# Patient Record
Sex: Female | Born: 1965
Health system: Southern US, Community
[De-identification: ages and names within clinical notes are randomized; demographics above are authoritative.]

## PROBLEM LIST (undated history)

## (undated) DIAGNOSIS — J329 Chronic sinusitis, unspecified: Secondary | ICD-10-CM

## (undated) DIAGNOSIS — R209 Unspecified disturbances of skin sensation: Secondary | ICD-10-CM

## (undated) DIAGNOSIS — J309 Allergic rhinitis, unspecified: Principal | ICD-10-CM

## (undated) DIAGNOSIS — R55 Syncope and collapse: Secondary | ICD-10-CM

## (undated) DIAGNOSIS — M79602 Pain in left arm: Secondary | ICD-10-CM

## (undated) HISTORY — PX: OTHER SURGICAL HISTORY: SHX169

## (undated) HISTORY — DX: Chronic sinusitis, unspecified: J32.9

## (undated) HISTORY — DX: Allergic rhinitis, unspecified: J30.9

## (undated) HISTORY — DX: Unspecified disturbances of skin sensation: R20.9

---

## 1997-11-20 ENCOUNTER — Other Ambulatory Visit: Admission: RE | Admit: 1997-11-20 | Discharge: 1997-11-20 | Payer: Self-pay | Admitting: Gynecology

## 1998-11-27 ENCOUNTER — Other Ambulatory Visit: Admission: RE | Admit: 1998-11-27 | Discharge: 1998-11-27 | Payer: Self-pay | Admitting: Gynecology

## 1999-12-08 ENCOUNTER — Other Ambulatory Visit: Admission: RE | Admit: 1999-12-08 | Discharge: 1999-12-08 | Payer: Self-pay | Admitting: Gynecology

## 2001-02-06 ENCOUNTER — Other Ambulatory Visit: Admission: RE | Admit: 2001-02-06 | Discharge: 2001-02-06 | Payer: Self-pay | Admitting: Gynecology

## 2002-03-27 ENCOUNTER — Other Ambulatory Visit: Admission: RE | Admit: 2002-03-27 | Discharge: 2002-03-27 | Payer: Self-pay | Admitting: Gynecology

## 2002-11-21 ENCOUNTER — Other Ambulatory Visit: Admission: RE | Admit: 2002-11-21 | Discharge: 2002-11-21 | Payer: Self-pay | Admitting: Dermatology

## 2003-05-21 ENCOUNTER — Emergency Department (HOSPITAL_COMMUNITY): Admission: EM | Admit: 2003-05-21 | Discharge: 2003-05-22 | Payer: Self-pay | Admitting: Emergency Medicine

## 2003-05-27 ENCOUNTER — Other Ambulatory Visit: Admission: RE | Admit: 2003-05-27 | Discharge: 2003-05-27 | Payer: Self-pay | Admitting: Gynecology

## 2003-06-04 ENCOUNTER — Encounter: Admission: RE | Admit: 2003-06-04 | Discharge: 2003-06-04 | Payer: Self-pay | Admitting: Gastroenterology

## 2003-06-07 ENCOUNTER — Encounter: Admission: RE | Admit: 2003-06-07 | Discharge: 2003-06-07 | Payer: Self-pay | Admitting: Gastroenterology

## 2003-06-14 ENCOUNTER — Encounter: Admission: RE | Admit: 2003-06-14 | Discharge: 2003-06-14 | Payer: Self-pay | Admitting: Gastroenterology

## 2004-07-21 ENCOUNTER — Other Ambulatory Visit: Admission: RE | Admit: 2004-07-21 | Discharge: 2004-07-21 | Payer: Self-pay | Admitting: Gynecology

## 2004-07-24 ENCOUNTER — Ambulatory Visit: Payer: Self-pay | Admitting: Internal Medicine

## 2005-08-05 ENCOUNTER — Other Ambulatory Visit: Admission: RE | Admit: 2005-08-05 | Discharge: 2005-08-05 | Payer: Self-pay | Admitting: Gynecology

## 2005-08-06 ENCOUNTER — Ambulatory Visit: Payer: Self-pay | Admitting: Family Medicine

## 2005-10-07 ENCOUNTER — Ambulatory Visit: Payer: Self-pay | Admitting: Internal Medicine

## 2005-10-08 ENCOUNTER — Ambulatory Visit: Payer: Self-pay | Admitting: Internal Medicine

## 2006-05-05 ENCOUNTER — Ambulatory Visit: Payer: Self-pay | Admitting: Internal Medicine

## 2006-05-19 ENCOUNTER — Ambulatory Visit: Payer: Self-pay | Admitting: Internal Medicine

## 2006-05-27 ENCOUNTER — Encounter: Admission: RE | Admit: 2006-05-27 | Discharge: 2006-05-27 | Payer: Self-pay | Admitting: Obstetrics and Gynecology

## 2007-03-03 ENCOUNTER — Ambulatory Visit: Payer: Self-pay | Admitting: Internal Medicine

## 2007-03-03 DIAGNOSIS — R51 Headache: Secondary | ICD-10-CM | POA: Insufficient documentation

## 2007-03-03 DIAGNOSIS — R209 Unspecified disturbances of skin sensation: Secondary | ICD-10-CM | POA: Insufficient documentation

## 2007-03-03 DIAGNOSIS — R519 Headache, unspecified: Secondary | ICD-10-CM | POA: Insufficient documentation

## 2007-03-03 HISTORY — DX: Unspecified disturbances of skin sensation: R20.9

## 2007-04-26 ENCOUNTER — Ambulatory Visit: Payer: Self-pay | Admitting: Internal Medicine

## 2007-04-26 LAB — CONVERTED CEMR LAB
ALT: 12 units/L (ref 0–35)
AST: 14 units/L (ref 0–37)
Albumin: 3.6 g/dL (ref 3.5–5.2)
Alkaline Phosphatase: 49 units/L (ref 39–117)
BUN: 9 mg/dL (ref 6–23)
Basophils Absolute: 0 10*3/uL (ref 0.0–0.1)
Basophils Relative: 0.6 % (ref 0.0–1.0)
Bilirubin Urine: NEGATIVE
Bilirubin, Direct: 0.2 mg/dL (ref 0.0–0.3)
CO2: 29 meq/L (ref 19–32)
Calcium: 9.2 mg/dL (ref 8.4–10.5)
Chloride: 105 meq/L (ref 96–112)
Cholesterol: 206 mg/dL (ref 0–200)
Creatinine, Ser: 0.8 mg/dL (ref 0.4–1.2)
Direct LDL: 126 mg/dL
Eosinophils Absolute: 0.5 10*3/uL (ref 0.0–0.6)
Eosinophils Relative: 6.2 % — ABNORMAL HIGH (ref 0.0–5.0)
GFR calc Af Amer: 103 mL/min
GFR calc non Af Amer: 85 mL/min
Glucose, Bld: 86 mg/dL (ref 70–99)
Glucose, Urine, Semiquant: NEGATIVE
HCT: 38.1 % (ref 36.0–46.0)
HDL: 50.6 mg/dL (ref >39.0)
Hemoglobin: 13.5 g/dL (ref 12.0–15.0)
Ketones, urine, test strip: NEGATIVE
Lymphocytes Relative: 27 % (ref 12.0–46.0)
MCHC: 35.5 g/dL (ref 30.0–36.0)
MCV: 91.2 fL (ref 78.0–100.0)
Monocytes Absolute: 0.5 10*3/uL (ref 0.2–0.7)
Monocytes Relative: 6.6 % (ref 3.0–11.0)
Neutro Abs: 4.9 10*3/uL (ref 1.4–7.7)
Neutrophils Relative %: 59.6 % (ref 43.0–77.0)
Nitrite: NEGATIVE
Platelets: 281 10*3/uL (ref 150–400)
Potassium: 4.3 meq/L (ref 3.5–5.1)
Protein, U semiquant: NEGATIVE
RBC: 4.17 M/uL (ref 3.87–5.11)
RDW: 11.2 % — ABNORMAL LOW (ref 11.5–14.6)
Sodium: 140 meq/L (ref 135–145)
Specific Gravity, Urine: 1.025
TSH: 1.95 microintl units/mL (ref 0.35–5.50)
Total Bilirubin: 0.7 mg/dL (ref 0.3–1.2)
Total CHOL/HDL Ratio: 4.1
Total Protein: 6.6 g/dL (ref 6.0–8.3)
Triglycerides: 128 mg/dL (ref 0–149)
Urobilinogen, UA: 0.2
VLDL: 26 mg/dL (ref 0–40)
WBC: 8.1 10*3/uL (ref 4.5–10.5)
pH: 5.5

## 2007-05-18 ENCOUNTER — Ambulatory Visit: Payer: Self-pay | Admitting: Internal Medicine

## 2007-05-18 DIAGNOSIS — J309 Allergic rhinitis, unspecified: Secondary | ICD-10-CM

## 2007-05-18 HISTORY — DX: Allergic rhinitis, unspecified: J30.9

## 2007-07-18 ENCOUNTER — Encounter: Admission: RE | Admit: 2007-07-18 | Discharge: 2007-07-18 | Payer: Self-pay | Admitting: Gynecology

## 2007-10-12 ENCOUNTER — Ambulatory Visit: Payer: Self-pay | Admitting: Internal Medicine

## 2007-10-12 DIAGNOSIS — J019 Acute sinusitis, unspecified: Secondary | ICD-10-CM | POA: Insufficient documentation

## 2008-01-30 ENCOUNTER — Telehealth: Payer: Self-pay | Admitting: Internal Medicine

## 2009-03-19 ENCOUNTER — Encounter: Admission: RE | Admit: 2009-03-19 | Discharge: 2009-03-19 | Payer: Self-pay | Admitting: Gynecology

## 2009-03-24 ENCOUNTER — Telehealth: Payer: Self-pay | Admitting: Internal Medicine

## 2010-04-09 ENCOUNTER — Encounter
Admission: RE | Admit: 2010-04-09 | Discharge: 2010-04-09 | Payer: Self-pay | Source: Home / Self Care | Attending: Gynecology | Admitting: Gynecology

## 2010-05-26 NOTE — Assessment & Plan Note (Signed)
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  The headaches are described as mild and paresthesias also mild and fleeting  Current Allergies: No known allergies       Physical Exam  General:     Well-developed,well-nourished,in no acute distress; alert,appropriate and cooperative throughout examination Head:     Normocephalic and atraumatic without obvious abnormalities. No apparent alopecia or balding. Eyes:     No corneal or conjunctival inflammation noted. EOMI. Perrla. Funduscopic exam benign, without hemorrhages, exudates or papilledema. Vision grossly normal. Ears:     External ear exam shows no significant lesions or deformities.  Otoscopic examination reveals clear canals, tympanic membranes are intact bilaterally without bulging, retraction, inflammation or discharge. Hearing is grossly normal bilaterally. Mouth:     Oral mucosa and oropharynx without lesions or exudates.  Teeth in good repair. Neck:     No deformities, masses, or tenderness noted. Lungs:     Normal respiratory effort, chest expands symmetrically. Lungs are clear to auscultation, no crackles or wheezes. Heart:     Normal rate and regular rhythm. S1 and S2 normal without gallop, murmur, click, rub or other extra sounds. Neurologic:     alert & oriented X3, cranial nerves II-XII intact, strength normal in all extremities, sensation intact to light touch, gait normal, finger-to-nose normal, toes down bilaterally on Babinski,  and Romberg negative.      Impression & Recommendations:  Problem # 1:  PARESTHESIA (ICD-782.0)  Problem # 2:  HEADACHE (ICD-784.0)   Patient Instructions: 1)  schedule a  complete physical with Dr. Lovell Sheehan at your convenience.  Call if not improved    ]

## 2010-05-26 NOTE — Progress Notes (Signed)
Summary: sinus  Phone Note Call from Patient   Caller: Patient Call For: Dr. Lovell Sheehan Summary of Call: Pt would like sinus Rx called to [facility]).   She has post nasal drainage with headache.  No fever.  Bloody nasal mucus. Nausea x 3 days. [phone] Initial call taken by: Lynann Beaver CMA,  January 30, 2008 9:53 AM  Follow-up for Phone Call        may have z pack and mucinex otc per dr Lovell Sheehan Follow-up by: Willy Eddy, LPN,  January 30, 2008 11:06 AM  Additional Follow-up for Phone Call Additional follow up Details #1::        Patient aware. Additional Follow-up by: Rudy Jew, RN,  January 30, 2008 11:23 AM    New/Updated Medications: ZITHROMAX Z-PAK 250 MG TABS (AZITHROMYCIN) as directed   Prescriptions: ZITHROMAX Z-PAK 250 MG TABS (AZITHROMYCIN) as directed  #1 x 0   Entered by:   Rudy Jew, RN   Authorized by:   Stacie Glaze MD   Signed by:   Rudy Jew, RN on 01/30/2008   Method used:   Telephoned to ...         RxID:   [id number]

## 2010-05-26 NOTE — Assessment & Plan Note (Signed)
Summary: physical/mae   Vital Signs:  Patient Profile:   45 Years Old Female Weight:      129 pounds Temp:     98.5 degrees F rectal Pulse rate:   69 / minute Resp:     14 per minute BP sitting:   110 / 70  (left arm)  Vitals Entered By: Willy Eddy, LPN (May 18, 2007 2:06 PM)                 Chief Complaint:  CPX/DR BOWIE DOES YEARLY PAP AND BREAST EXAM/DT IN 2004/.  History of Present Illness: CPX Current Problems:  PHYSICAL EXAMINATION (ICD-V70.0) PARESTHESIA (ICD-782.0) HEADACHE (ICD-784.0)    Current Allergies (reviewed today): No known allergies  Updated/Current Medications (including changes made in today's visit):  TRIVORA (28)   TABS (LEVONORG-ETH ESTRAD TRIPHASIC) AS DIRECTED   Past Medical History:    Reviewed history and no changes required:       Allergic rhinitis  Past Surgical History:    Reviewed history and no changes required:       Denies surgical history   Family History:    Reviewed history and no changes required:       mother         Family History High cholesterol       Family History Hypertension       father  Social History:    Reviewed history and no changes required:       Married       Never Smoked       Alcohol use-no       Drug use-no       Regular exercise-yes   Risk Factors:  Tobacco use:  never Passive smoke exposure:  no Drug use:  no HIV high-risk behavior:  no Alcohol use:  no Exercise:  yes  Family History Risk Factors:    Family History of MI in females < 39 years old:  no    Family History of MI in males < 80 years old:  no   Review of Systems  The patient denies anorexia, fever, weight loss, vision loss, decreased hearing, hoarseness, chest pain, syncope, dyspnea on exhertion, peripheral edema, prolonged cough, hemoptysis, abdominal pain, melena, hematochezia, severe indigestion/heartburn, hematuria, incontinence, genital sores, muscle weakness, suspicious skin lesions, transient  blindness, difficulty walking, depression, unusual weight change, abnormal bleeding, enlarged lymph nodes, angioedema, and breast masses.     Physical Exam  General:     Well-developed,well-nourished,in no acute distress; alert,appropriate and cooperative throughout examination Head:     Normocephalic and atraumatic without obvious abnormalities. No apparent alopecia or balding. Eyes:     No corneal or conjunctival inflammation noted. EOMI. Perrla. Funduscopic exam benign, without hemorrhages, exudates or papilledema. Vision grossly normal. Ears:     External ear exam shows no significant lesions or deformities.  Otoscopic examination reveals clear canals, tympanic membranes are intact bilaterally without bulging, retraction, inflammation or discharge. Hearing is grossly normal bilaterally. Nose:     External nasal examination shows no deformity or inflammation. Nasal mucosa are pink and moist without lesions or exudates. Mouth:     Oral mucosa and oropharynx without lesions or exudates.  Teeth in good repair. Neck:     No deformities, masses, or tenderness noted. Chest Wall:     No deformities, masses, or tenderness noted. Lungs:     Normal respiratory effort, chest expands symmetrically. Lungs are clear to auscultation, no crackles or wheezes. Heart:  Normal rate and regular rhythm. S1 and S2 normal without gallop, murmur, click, rub or other extra sounds. Abdomen:     Bowel sounds positive,abdomen soft and non-tender without masses, organomegaly or hernias noted. Msk:     No deformity or scoliosis noted of thoracic or lumbar spine.   Pulses:     R and L carotid,radial,femoral,dorsalis pedis and posterior tibial pulses are full and equal bilaterally Extremities:     No clubbing, cyanosis, edema, or deformity noted with normal full range of motion of all joints.   Neurologic:     No cranial nerve deficits noted. Station and gait are normal. Plantar reflexes are down-going  bilaterally. DTRs are symmetrical throughout. Sensory, motor and coordinative functions appear intact.    Complete Medication List: 1)  Trivora (28) Tabs (Levonorg-eth estrad triphasic) .... As directed   Patient Instructions: 1)  Please schedule a follow-up appointment in 1 year. 2)  take fish oil 1000mg   two capsules daily    ]

## 2010-05-26 NOTE — Assessment & Plan Note (Signed)
Summary: sinus problems/njr   Vital Signs:  Patient Profile:   45 Years Old Female Temp:     98.7 degrees F oral Pulse rate:   76 / minute Resp:     14 per minute BP sitting:   110 / 70  (left arm)  Vitals Entered By: Willy Eddy, LPN (October 12, 2007 11:44 AM)                 Chief Complaint:  c/o sinusitis like sx with  cough and URI symptoms.  History of Present Illness: Current Problems:  ALLERGIC RHINITIS (ICD-477.9) PHYSICAL EXAMINATION (ICD-V70.0) PARESTHESIA (ICD-782.0) HEADACHE (ICD-784.0)    URI Symptoms      This is a 45 year old woman who presents with URI symptoms.  The patient reports nasal congestion, sore throat, and dry cough.  Associated symptoms include low-grade fever (<100.5 degrees).  The patient denies fever, fever of 100.5-103 degrees, fever of 103.1-104 degrees, fever to >104 degrees, stiff neck, dyspnea, wheezing, rash, vomiting, diarrhea, use of an antipyretic, and response to antipyretic.  The patient also reports itchy throat, headache, muscle aches, and severe fatigue.  The patient denies the following risk factors for Strep sinusitis: unilateral facial pain, unilateral nasal discharge, poor response to decongestant, double sickening, tooth pain, Strep exposure, tender adenopathy, and absence of cough.      Current Allergies: No known allergies   Past Medical History:    Reviewed history from 05/18/2007 and no changes required:       Allergic rhinitis   Family History:    Reviewed history from 05/18/2007 and no changes required:       mother         Family History High cholesterol       Family History Hypertension       father  Social History:    Reviewed history from 05/18/2007 and no changes required:       Married       Never Smoked       Alcohol use-no       Drug use-no       Regular exercise-yes    Review of Systems       The patient complains of fever, hoarseness, chest pain, and prolonged cough.  The patient denies  anorexia, weight loss, weight gain, vision loss, decreased hearing, syncope, dyspnea on exertion, peripheral edema, headaches, hemoptysis, abdominal pain, melena, hematochezia, severe indigestion/heartburn, hematuria, incontinence, genital sores, muscle weakness, suspicious skin lesions, transient blindness, difficulty walking, depression, unusual weight change, abnormal bleeding, enlarged lymph nodes, angioedema, and breast masses.     Physical Exam  General:     Well-developed,well-nourished,in no acute distress; alert,appropriate and cooperative throughout examination Eyes:     No corneal or conjunctival inflammation noted. EOMI. Perrla. Funduscopic exam benign, without hemorrhages, exudates or papilledema. Vision grossly normal. Ears:     External ear exam shows no significant lesions or deformities.  Otoscopic examination reveals clear canals, tympanic membranes are intact bilaterally without bulging, retraction, inflammation or discharge. Hearing is grossly normal bilaterally. Nose:     mucosal erythema and mucosal edema.   Neck:     supple and cervical lymphadenopathy.   Lungs:     Normal respiratory effort, chest expands symmetrically. Lungs are clear to auscultation, no crackles or wheezes. Heart:     Normal rate and regular rhythm. S1 and S2 normal without gallop, murmur, click, rub or other extra sounds. Abdomen:     Bowel sounds positive,abdomen soft and  non-tender without masses, organomegaly or hernias noted.    Impression & Recommendations:  Problem # 1:  ALLERGIC RHINITIS (ICD-477.9) precipatant  Problem # 2:  ACUTE SINUSITIS, UNSPECIFIED (ICD-461.9)  Her updated medication list for this problem includes:    Avelox 400 Mg Tabs (Moxifloxacin hcl) ..... One by mouth daily    Maxiphen Dm 10-20-400 Mg Tabs (Phenylephrine-dm-gg) ..... One by mouth q 6 hr Instructed on treatment. Call if symptoms persist or worsen.   Problem # 3:  HEADACHE (ICD-784.0) Headache diary  reviewed.   Complete Medication List: 1)  Trivora (43) Tabs (Levonorg-eth estrad triphasic) .... As directed 2)  Avelox 400 Mg Tabs (Moxifloxacin hcl) .... One by mouth daily 3)  Maxiphen Dm 10-20-400 Mg Tabs (Phenylephrine-dm-gg) .... One by mouth q 6 hr   Patient Instructions: 1)  Take your antibiotic as prescribed until ALL of it is gone, but stop if you develop a rash or swelling and contact our office as soon as possible.   ]

## 2010-05-26 NOTE — Progress Notes (Signed)
Summary: WANTS REILEF  Phone Note Call from Patient Call back at [phone]   Caller: Patient-LIVE Call Complaint: Cough/Sore throat Summary of Call: HAS SINUS CONGESTION, NON PRODUCTIVE COUGH, RUNNY NOSE, NO FEVER. PT WOULD LIKE ABX AND COUGH SYRUP CALLED TO [facility]. Initial call taken by: Warnell Forester,  March 24, 2009 4:00 PM  Follow-up for Phone Call        zpack and atuss called in per dr Esperanza Heir and pt notified Follow-up by: Willy Eddy, LPN,  March 24, 2009 5:23 PM    New/Updated Medications: ATUSS DS 30-4-30 MG/5ML SUSP (PSEUDOEPHED HCL-CPM-DM HBR TAN) 2 tsp every 12 hours Prescriptions: ATUSS DS 30-4-30 MG/5ML SUSP (PSEUDOEPHED HCL-CPM-DM HBR TAN) 2 tsp every 12 hours  #6oz x 1   Entered by:   Willy Eddy, LPN   Authorized by:   Stacie Glaze MD   Signed by:   Willy Eddy, LPN on 84/69/6295   Method used:   Electronically to        [facility] (retail)       [address]       [place], [place]  [address]       Ph: [phone]       Fax: [phone]   RxID:   [id number] ZITHROMAX Z-PAK 250 MG TABS (AZITHROMYCIN) as directed  #1 x 0   Entered by:   Willy Eddy, LPN   Authorized by:   Stacie Glaze MD   Signed by:   Willy Eddy, LPN on 18/84/1660   Method used:   Electronically to        [facility] (retail)       [address]       [place], [place]  [address]       Ph: [phone]       Fax: [phone]   RxID:   [id number]

## 2010-09-24 ENCOUNTER — Encounter: Payer: Self-pay | Admitting: Internal Medicine

## 2010-09-24 ENCOUNTER — Ambulatory Visit (INDEPENDENT_AMBULATORY_CARE_PROVIDER_SITE_OTHER): Payer: BC Managed Care – PPO | Admitting: Internal Medicine

## 2010-09-24 VITALS — BP 108/70 | Temp 98.7°F | Ht 64.0 in | Wt 128.0 lb

## 2010-09-24 DIAGNOSIS — J329 Chronic sinusitis, unspecified: Secondary | ICD-10-CM

## 2010-09-24 HISTORY — DX: Chronic sinusitis, unspecified: J32.9

## 2010-09-24 MED ORDER — AMOXICILLIN-POT CLAVULANATE 875-125 MG PO TABS: 1.0000 | ORAL_TABLET | Freq: Two times a day (BID) | ORAL | Status: AC

## 2010-09-24 NOTE — Assessment & Plan Note (Signed)
Begin Augmetin x 7 days. Change AH to benadryl at night. Followup if no improvement or worsening.

## 2010-09-24 NOTE — Progress Notes (Signed)
  Subjective:    Patient ID: Robyn Medina, female    DOB: May 16, 1965, 45 y.o.   MRN: [id number]  HPI Pt presents to clinic for evaluation of sinus congestion. Notes 8+day h/o bilateral maxillary and frontal sinus pressure and teeth pain. Has associated nasal congestion and drainage worse at night. No significant cough at this point. Denies f/c. Taking otc anti-histamine, decongestant and mucinex without significant improvement. No alleviating or exacerbating factors. No other complaints.  Reviewed pmh, medications and allergies.    Review of Systems see hpi     Objective:   Physical Exam  Nursing note and vitals reviewed. Constitutional: She appears well-developed and well-nourished. No distress.  HENT:  Head: Normocephalic and atraumatic.  Right Ear: Tympanic membrane, external ear and ear canal normal.  Left Ear: Tympanic membrane, external ear and ear canal normal.  Mouth/Throat: Uvula is midline and mucous membranes are normal. Posterior oropharyngeal erythema present. No oropharyngeal exudate or tonsillar abscesses.  Eyes: Conjunctivae are normal. Right eye exhibits no discharge. Left eye exhibits no discharge. No scleral icterus.  Neck: Neck supple.  Lymphadenopathy:    She has no cervical adenopathy.  Neurological: She is alert.  Skin: Skin is warm and dry. She is not diaphoretic.  Psychiatric: She has a normal mood and affect.          Assessment & Plan:

## 2011-01-26 ENCOUNTER — Ambulatory Visit (INDEPENDENT_AMBULATORY_CARE_PROVIDER_SITE_OTHER): Payer: BC Managed Care – PPO | Admitting: Internal Medicine

## 2011-01-26 ENCOUNTER — Encounter: Payer: Self-pay | Admitting: Internal Medicine

## 2011-01-26 DIAGNOSIS — J309 Allergic rhinitis, unspecified: Secondary | ICD-10-CM

## 2011-01-26 DIAGNOSIS — J069 Acute upper respiratory infection, unspecified: Secondary | ICD-10-CM

## 2011-01-26 MED ORDER — HYDROCODONE-HOMATROPINE 5-1.5 MG/5ML PO SYRP: 5.0000 mL | ORAL_SOLUTION | Freq: Four times a day (QID) | ORAL | Status: AC | PRN

## 2011-01-26 NOTE — Progress Notes (Signed)
  Subjective:    Patient ID: Robyn Medina, female    DOB: [birth year], 45 y.o.   MRN: [id number]  HPI  45 year old patient who presents with a seven-day history of head and chest congestion. Her most significant symptom is refractory in incessant cough. Cough is nonproductive there has been no fever or purulent sputum production denies any wheezing chest pain high fever or chills she has been using over-the-counter medications without much benefit. Her husband has a similar illness    Review of Systems  Constitutional: Negative.   HENT: Positive for congestion and rhinorrhea. Negative for hearing loss, sore throat, dental problem, sinus pressure and tinnitus.   Eyes: Negative for pain, discharge and visual disturbance.  Respiratory: Positive for cough. Negative for shortness of breath.   Cardiovascular: Negative for chest pain, palpitations and leg swelling.  Gastrointestinal: Negative for nausea, vomiting, abdominal pain, diarrhea, constipation, blood in stool and abdominal distention.  Genitourinary: Negative for dysuria, urgency, frequency, hematuria, flank pain, vaginal bleeding, vaginal discharge, difficulty urinating, vaginal pain and pelvic pain.  Musculoskeletal: Negative for joint swelling, arthralgias and gait problem.  Skin: Negative for rash.  Neurological: Negative for dizziness, syncope, speech difficulty, weakness, numbness and headaches.  Hematological: Negative for adenopathy.  Psychiatric/Behavioral: Negative for behavioral problems, dysphoric mood and agitation. The patient is not nervous/anxious.        Objective:   Physical Exam  Constitutional: She is oriented to person, place, and time. She appears well-developed and well-nourished.  HENT:  Head: Normocephalic.  Right Ear: External ear normal.  Left Ear: External ear normal.  Mouth/Throat: Oropharynx is clear and moist.  Eyes: Conjunctivae and EOM are normal. Pupils are equal, round, and reactive to light.  Neck:  Normal range of motion. Neck supple. No thyromegaly present.  Cardiovascular: Normal rate, regular rhythm, normal heart sounds and intact distal pulses.   Pulmonary/Chest: Effort normal and breath sounds normal.  Abdominal: Soft. Bowel sounds are normal. She exhibits no mass. There is no tenderness.  Musculoskeletal: Normal range of motion.  Lymphadenopathy:    She has no cervical adenopathy.  Neurological: She is alert and oriented to person, place, and time.  Skin: Skin is warm and dry. No rash noted.  Psychiatric: She has a normal mood and affect. Her behavior is normal.          Assessment & Plan:    Viral URI. Will treat symptomatically with Hydromet. Will also give a nasal steroid  (Nasonex)

## 2011-01-26 NOTE — Patient Instructions (Signed)
Get plenty of rest, Drink lots of  clear liquids, and use Tylenol or ibuprofen for fever and discomfort.    Call or return to clinic prn if these symptoms worsen or fail to improve as anticipated.  Nasonex  Use once daily

## 2011-04-26 ENCOUNTER — Telehealth: Payer: Self-pay | Admitting: *Deleted

## 2011-04-26 MED ORDER — CITALOPRAM HYDROBROMIDE 20 MG PO TABS: 20.0000 mg | ORAL_TABLET | Freq: Every day | ORAL | Status: DC

## 2011-04-26 MED ORDER — ALPRAZOLAM 0.25 MG PO TABS: 0.2500 mg | ORAL_TABLET | Freq: Every evening | ORAL | Status: AC | PRN

## 2011-04-26 NOTE — Telephone Encounter (Signed)
Pt has been crying non stop.  No energy, doesn't want to go anywhere or do anything.  Denies suicidal thoughts.  Per Dr Lovell Sheehan ok to call in xanax .25mg  qhs for anxiety and sleep and celexa 20 mg once daily #30 with 1 refill. [facility] [place].  Dr Lovell Sheehan would like to see in her 1 week. rx's called in

## 2011-04-28 NOTE — Telephone Encounter (Signed)
Left message on machine For pt to return call and tell her of ov on 1-7 ay 8:15

## 2011-04-28 NOTE — Telephone Encounter (Signed)
Left message on machine For appointment on 1-7 at 8:15

## 2011-05-03 ENCOUNTER — Ambulatory Visit (INDEPENDENT_AMBULATORY_CARE_PROVIDER_SITE_OTHER): Payer: BC Managed Care – PPO | Admitting: Internal Medicine

## 2011-05-03 ENCOUNTER — Encounter: Payer: Self-pay | Admitting: Internal Medicine

## 2011-05-03 VITALS — BP 120/70 | HR 72 | Temp 98.3°F | Resp 14 | Ht 64.0 in | Wt 122.0 lb

## 2011-05-03 DIAGNOSIS — F430 Acute stress reaction: Secondary | ICD-10-CM

## 2011-05-03 DIAGNOSIS — M62838 Other muscle spasm: Secondary | ICD-10-CM

## 2011-05-03 MED ORDER — CHLORZOXAZONE 250 MG PO TABS: 250.0000 mg | ORAL_TABLET | Freq: Four times a day (QID) | ORAL | Status: AC | PRN

## 2011-05-03 NOTE — Progress Notes (Signed)
Subjective:    Patient ID: Robyn Medina, female    DOB: 1965/07/17, 46 y.o.   MRN: [id number]  HPI Patient is a 46 year old white female who decompensated from a chemotherapy to stress was tearful crying unable to work we initiated therapy with Xanax and Celexa and she has had an interval change she is able to cope she states that her coworkers have noticed that she is responding better and not as tearful as she was before and she feels herself that she is doing better.  She admits that recently there've been increased stressors in her life and she has stopped exercising which was a stress reliever.  We discussed referral to psychiatry for counseling and she has accepted a referral to [facility] on.  She has history of allergic rhinitis headaches and she has increased neck tension pain today  She states that she has occipital pain radiating down the posterior neck into the trapezius bilaterally increased with motion of the head   Review of Systems  Constitutional: Negative for activity change, appetite change and fatigue.  HENT: Negative for ear pain, congestion, neck pain, postnasal drip and sinus pressure.   Eyes: Negative for redness and visual disturbance.  Respiratory: Negative for cough, shortness of breath and wheezing.   Gastrointestinal: Negative for abdominal pain and abdominal distention.  Genitourinary: Negative for dysuria, frequency and menstrual problem.  Musculoskeletal: Negative for myalgias, joint swelling and arthralgias.  Skin: Negative for rash and wound.  Neurological: Negative for dizziness, weakness and headaches.  Hematological: Negative for adenopathy. Does not bruise/bleed easily.  Psychiatric/Behavioral: Negative for sleep disturbance and decreased concentration.   Past Medical History  Diagnosis Date  . ALLERGIC RHINITIS 05/18/2007  . Headache 03/03/2007  . PARESTHESIA 03/03/2007  . Sinusitis 09/24/2010    History   Social History  . Marital Status: Married     Spouse Name: N/A    Number of Children: N/A  . Years of Education: N/A   Occupational History  . Not on file.   Social History Main Topics  . Smoking status: Never Smoker   . Smokeless tobacco: Never Used  . Alcohol Use: Yes  . Drug Use: No  . Sexually Active: Not on file   Other Topics Concern  . Not on file   Social History Narrative  . No narrative on file    No past surgical history on file.  Family History  Problem Relation Age of Onset  . Hyperlipidemia Neg Hx     family hx  . Hypertension Neg Hx     family hx    No Known Allergies  Current Outpatient Prescriptions on File Prior to Visit  Medication Sig Dispense Refill  . ALPRAZolam (XANAX) 0.25 MG tablet Take 1 tablet (0.25 mg total) by mouth at bedtime as needed for sleep or anxiety.  20 tablet  0  . citalopram (CELEXA) 20 MG tablet Take 1 tablet (20 mg total) by mouth daily.  30 tablet  1  . loratadine-pseudoephedrine (CLARITIN-D 24-HOUR) 10-240 MG per 24 hr tablet Take 1 tablet by mouth daily.          BP 120/70  Pulse 72  Temp 98.3 F (36.8 C)  Resp 14  Ht 5\' 4"  (1.626 m)  Wt 122 lb (55.339 kg)  BMI 20.94 kg/m2       Objective:   Physical Exam  Nursing note and vitals reviewed. Constitutional: She is oriented to person, place, and time. She appears well-developed and well-nourished. No distress.  HENT:  Head: Normocephalic and atraumatic.  Right Ear: External ear normal.  Left Ear: External ear normal.  Nose: Nose normal.  Mouth/Throat: Oropharynx is clear and moist.  Eyes: Conjunctivae and EOM are normal. Pupils are equal, round, and reactive to light.  Neck: Normal range of motion. Neck supple. No JVD present. No tracheal deviation present. No thyromegaly present.  Cardiovascular: Normal rate, regular rhythm, normal heart sounds and intact distal pulses.   No murmur heard. Pulmonary/Chest: Effort normal and breath sounds normal. She has no wheezes. She exhibits no tenderness.    Abdominal: Soft. Bowel sounds are normal.  Musculoskeletal: Normal range of motion. She exhibits no edema and no tenderness.  Lymphadenopathy:    She has no cervical adenopathy.  Neurological: She is alert and oriented to person, place, and time. She has normal reflexes. No cranial nerve deficit.  Skin: Skin is warm and dry. She is not diaphoretic.  Psychiatric: She has a normal mood and affect. Her behavior is normal.          Assessment & Plan:  For the next does him we have prescribed Parafon Forte 250 mg by mouth 4 times a day as needed we recommended she take 200 mg of Advil along with Parafon Forte as required and begin neck stretching and ergonomic review of workplace stress.  For her depression we continue the Celexa and Xanax with a referral for counseling.  We discussed this 30 minutes face-to-face

## 2011-05-03 NOTE — Patient Instructions (Addendum)
The patient is instructed to continue all medications as prescribed. Schedule followup with check out clerk upon leaving the clinic Take advil 200 mg with the parafon forte

## 2011-05-05 ENCOUNTER — Telehealth: Payer: Self-pay | Admitting: *Deleted

## 2011-05-05 NOTE — Telephone Encounter (Signed)
LMTCB

## 2011-05-05 NOTE — Telephone Encounter (Signed)
He doesn't think it would be the celexa--probably sinus -- try mucinex fast max

## 2011-05-05 NOTE — Telephone Encounter (Signed)
Pt has been having facial pain and headache x several days, and is wondering if it is sinus or maybe the Celexa.  She is having some post nasal drainage.  Is asking what Dr. Lovell Sheehan recommendations are.

## 2011-05-06 NOTE — Telephone Encounter (Signed)
Call pt back at [phone] after 4 pm, please.

## 2011-05-06 NOTE — Telephone Encounter (Signed)
Attempted to call pt. No answer.

## 2011-06-02 ENCOUNTER — Ambulatory Visit (INDEPENDENT_AMBULATORY_CARE_PROVIDER_SITE_OTHER): Payer: BC Managed Care – PPO | Admitting: Internal Medicine

## 2011-06-02 ENCOUNTER — Encounter: Payer: Self-pay | Admitting: Internal Medicine

## 2011-06-02 VITALS — BP 120/70 | HR 72 | Temp 98.2°F | Resp 14 | Ht 65.0 in | Wt 122.0 lb

## 2011-06-02 DIAGNOSIS — F329 Major depressive disorder, single episode, unspecified: Secondary | ICD-10-CM

## 2011-06-02 DIAGNOSIS — F32A Depression, unspecified: Secondary | ICD-10-CM

## 2011-06-02 DIAGNOSIS — T887XXA Unspecified adverse effect of drug or medicament, initial encounter: Secondary | ICD-10-CM

## 2011-06-02 MED ORDER — CITALOPRAM HYDROBROMIDE 20 MG PO TABS: 20.0000 mg | ORAL_TABLET | Freq: Every day | ORAL | Status: DC

## 2011-06-02 NOTE — Progress Notes (Signed)
  Subjective:    Patient ID: Robyn Medina, female    DOB: 11-Jun-1965, 47 y.o.   MRN: [id number]  HPI vision presents for monitoring of therapy for situational depression and a possible side effect of medication.  She is an exceptionally well on low-dose Celexa mood is responded she is doing better at work and functioning better.      Review of Systems  Constitutional: Negative for activity change, appetite change and fatigue.  HENT: Negative for ear pain, congestion, neck pain, postnasal drip and sinus pressure.   Eyes: Negative for redness and visual disturbance.  Respiratory: Negative for cough, shortness of breath and wheezing.   Gastrointestinal: Negative for abdominal pain and abdominal distention.  Genitourinary: Negative for dysuria, frequency and menstrual problem.  Musculoskeletal: Negative for myalgias, joint swelling and arthralgias.  Skin: Negative for rash and wound.  Neurological: Negative for dizziness, weakness and headaches.  Hematological: Negative for adenopathy. Does not bruise/bleed easily.  Psychiatric/Behavioral: Negative for sleep disturbance and decreased concentration.       Objective:   Physical Exam  Constitutional: She is oriented to person, place, and time. She appears well-developed and well-nourished. No distress.  HENT:  Head: Normocephalic and atraumatic.  Right Ear: External ear normal.  Left Ear: External ear normal.  Nose: Nose normal.  Mouth/Throat: Oropharynx is clear and moist.  Eyes: Conjunctivae and EOM are normal. Pupils are equal, round, and reactive to light.  Neck: Normal range of motion. Neck supple. No JVD present. No tracheal deviation present. No thyromegaly present.  Cardiovascular: Normal rate, regular rhythm, normal heart sounds and intact distal pulses.   No murmur heard. Pulmonary/Chest: Effort normal and breath sounds normal. She has no wheezes. She exhibits no tenderness.  Abdominal: Soft. Bowel sounds are normal.    Musculoskeletal: Normal range of motion. She exhibits no edema and no tenderness.  Lymphadenopathy:    She has no cervical adenopathy.  Neurological: She is alert and oriented to person, place, and time. She has normal reflexes. No cranial nerve deficit.  Skin: Skin is warm and dry. She is not diaphoretic.  Psychiatric: She has a normal mood and affect. Her behavior is normal.          Assessment & Plan:  Stable medication has been on Celexa but new medication for 6 months discussed about timing of medication for approximately one year before consider a drug holiday.  Set patient up for physical

## 2011-06-02 NOTE — Patient Instructions (Signed)
The patient is instructed to continue all medications as prescribed. Schedule followup with check out clerk upon leaving the clinic  

## 2011-07-02 ENCOUNTER — Other Ambulatory Visit: Payer: Self-pay | Admitting: Internal Medicine

## 2011-07-05 NOTE — Telephone Encounter (Signed)
Dr. Jenkins pt. 

## 2011-07-12 ENCOUNTER — Other Ambulatory Visit: Payer: Self-pay | Admitting: Gynecology

## 2011-07-12 DIAGNOSIS — Z1231 Encounter for screening mammogram for malignant neoplasm of breast: Secondary | ICD-10-CM

## 2011-07-26 ENCOUNTER — Ambulatory Visit
Admission: RE | Admit: 2011-07-26 | Discharge: 2011-07-26 | Disposition: A | Discharge status: Home / Self Care | Payer: BC Managed Care – PPO | Source: Ambulatory Visit | Attending: Gynecology | Admitting: Gynecology

## 2011-07-26 ENCOUNTER — Ambulatory Visit: Payer: BC Managed Care – PPO

## 2011-07-26 DIAGNOSIS — Z1231 Encounter for screening mammogram for malignant neoplasm of breast: Secondary | ICD-10-CM

## 2011-10-01 ENCOUNTER — Encounter: Payer: Self-pay | Admitting: Internal Medicine

## 2011-10-01 ENCOUNTER — Ambulatory Visit (INDEPENDENT_AMBULATORY_CARE_PROVIDER_SITE_OTHER): Payer: BC Managed Care – PPO | Admitting: Internal Medicine

## 2011-10-01 VITALS — BP 124/78 | HR 72 | Temp 98.3°F | Resp 16 | Ht 65.0 in | Wt 130.0 lb

## 2011-10-01 DIAGNOSIS — R51 Headache: Secondary | ICD-10-CM

## 2011-10-01 DIAGNOSIS — J329 Chronic sinusitis, unspecified: Secondary | ICD-10-CM

## 2011-10-01 DIAGNOSIS — J309 Allergic rhinitis, unspecified: Secondary | ICD-10-CM

## 2011-10-01 MED ORDER — LEVOFLOXACIN 500 MG PO TABS: 500.0000 mg | ORAL_TABLET | Freq: Every day | ORAL | Status: AC

## 2011-10-01 MED ORDER — MOMETASONE FUROATE 50 MCG/ACT NA SUSP: 2.0000 | Freq: Every day | NASAL | Status: DC

## 2011-10-01 NOTE — Progress Notes (Signed)
  Subjective:    Patient ID: Luiz Ochoa, female    DOB: 06-26-1965, 46 y.o.   MRN: [id number]  HPI  Patient is a 46 year old female with a history of allergic rhinitis and a history of headaches  She presents today with a sore throat postnasal drip and a constant "headache" the symptoms seem to worsen with movement of the head the headache appears to be in the ethmoid and frontal sinus areas.   Review of Systems  Constitutional: Negative for fever.  HENT: Positive for ear pain, congestion, rhinorrhea and postnasal drip.   Eyes: Negative for photophobia, redness and visual disturbance.  Respiratory: Negative for cough and shortness of breath.        Objective:   Physical Exam  Nursing note and vitals reviewed. Constitutional: She appears well-developed and well-nourished.  HENT:       Inflammation of sinuses was indeterminate and postnasal drip with cobblestoning and erythema  Eyes: Conjunctivae normal are normal. Pupils are equal, round, and reactive to light.  Cardiovascular: Normal rate and regular rhythm.   Pulmonary/Chest: Effort normal and breath sounds normal.          Assessment & Plan:  Acute sinusitis Treatment with a seven-day course of Levaquin

## 2011-11-22 ENCOUNTER — Ambulatory Visit (INDEPENDENT_AMBULATORY_CARE_PROVIDER_SITE_OTHER): Payer: BC Managed Care – PPO | Admitting: Internal Medicine

## 2011-11-22 ENCOUNTER — Encounter: Payer: Self-pay | Admitting: Internal Medicine

## 2011-11-22 VITALS — BP 106/72 | HR 78 | Temp 98.6°F | Wt 134.0 lb

## 2011-11-22 DIAGNOSIS — R519 Headache, unspecified: Secondary | ICD-10-CM | POA: Insufficient documentation

## 2011-11-22 DIAGNOSIS — R51 Headache: Secondary | ICD-10-CM

## 2011-11-22 DIAGNOSIS — R5383 Other fatigue: Secondary | ICD-10-CM | POA: Insufficient documentation

## 2011-11-22 DIAGNOSIS — R5381 Other malaise: Secondary | ICD-10-CM

## 2011-11-22 DIAGNOSIS — J310 Chronic rhinitis: Secondary | ICD-10-CM | POA: Insufficient documentation

## 2011-11-22 MED ORDER — FLUTICASONE PROPIONATE 50 MCG/ACT NA SUSP: NASAL | Status: DC

## 2011-11-22 MED ORDER — CEFUROXIME AXETIL 500 MG PO TABS: 500.0000 mg | ORAL_TABLET | Freq: Two times a day (BID) | ORAL | Status: AC

## 2011-11-22 NOTE — Patient Instructions (Signed)
Stay on the nasal cortisone to  Prevent sinus infections.  Look at your sleep patterns to evaluate the fatigue.  cpx or ov with dr Lovell Sheehan to evaluate .

## 2011-11-22 NOTE — Progress Notes (Signed)
  Subjective:    Patient ID: Robyn Medina, female    DOB: 1965-04-30, 46 y.o.   MRN: [id number]  HPI Patient comes in today for SDA for  new problem evaluation. PCP booked today . i think it is sinus problems   drainage in am and night. Head to explode and  Day not as bad.   Tylenol sinus not a lot of help. Sick last week.   No vomiting.  Diarrhea last week immodium and went away. No significant fever or chills. Tends to get sinus infections and feels like this she is on no prevention. No head trauma history of migraines. nasonex .  Last visit sample.  Period last week.  Cutting down caffiene etoh  None.  Asks about fatigue feels to be tired all the time. Does awaken every few hours to use the restroom. Review of Systems No fever chest pain shortness of breath syncope change in vision or hearing unusual rashes today. Past history family history social history reviewed in the electronic medical record. On no meds  Except as above     Objective:   Physical Exam  BP 106/72  Pulse 78  Temp 98.6 F (37 C) (Oral)  Wt 134 lb (60.782 kg)  SpO2 96%  LMP 11/17/2011 WDWN in nad looks almost alergic and tired. HEENT: Normocephalic ;atraumatic , Eyes;  PERRL, EOMs  Full, lids and conjunctiva clear,,Ears: no deformities, canals nl, TM landmarks normal, Nose: no deformity or discharge congested   Mouth : OP clear without lesion or edema . Maxillary/ frontal area is tender    Neck: Supple without adenopathy or masses or bruits Chest:  Clear to A without wheezes rales or rhonchi CV:  S1-S2 no gallops or murmurs peripheral perfusion is normal Abdomen:  Sof,t normal bowel sounds without hepatosplenomegaly, no guarding rebound or masses no CVA tenderness No clubbing cyanosis or edema Skin: normal capillary refill ,turgor , color: No acute rashes ,petechiae or bruising    Assessment & Plan:  Over 10 days of illness  Face pain PND  Nausea and fatigue. Like a sinus infection could have other causes   Poss chronic rhinitis  And acute secondary infection   Rotating antibiotic and restart a nasal steroid .  And continue. unsure what to make of the diarrhea last week. Had  Augmentin over a month a go.  t fatigue   With PCP consider wakening to void as an interference.  And cause .  Needs to see PCP for this.

## 2012-05-22 ENCOUNTER — Other Ambulatory Visit: Payer: Self-pay | Admitting: Internal Medicine

## 2012-05-29 ENCOUNTER — Encounter: Payer: Self-pay | Admitting: Family Medicine

## 2012-05-29 ENCOUNTER — Ambulatory Visit (INDEPENDENT_AMBULATORY_CARE_PROVIDER_SITE_OTHER): Payer: No Typology Code available for payment source | Admitting: Family Medicine

## 2012-05-29 VITALS — BP 108/70 | HR 75 | Temp 97.6°F | Ht 65.0 in | Wt 128.0 lb

## 2012-05-29 DIAGNOSIS — J069 Acute upper respiratory infection, unspecified: Secondary | ICD-10-CM

## 2012-05-29 DIAGNOSIS — J309 Allergic rhinitis, unspecified: Secondary | ICD-10-CM

## 2012-05-29 MED ORDER — GUAIFENESIN-CODEINE 100-10 MG/5ML PO SYRP: 5.0000 mL | ORAL_SOLUTION | Freq: Three times a day (TID) | ORAL | Status: DC | PRN

## 2012-05-29 MED ORDER — FLUTICASONE PROPIONATE 50 MCG/ACT NA SUSP: NASAL | Status: DC

## 2012-05-29 NOTE — Progress Notes (Signed)
Chief Complaint  Patient presents with  . Cough  . Sore Throat  . Nasal Congestion  . Facial Pain    "around my sinuses"  . Nausea  . "my head hurts around my sinuses"    HPI:  -started: 1 week ago -symptoms:sneezing, itchy nose, nasal congestion, sore throat, cough, sinus pressure and pain -denies:fever, SOB, NVD, tooth pain -has tried: has tried nyquil -sick contacts: none known -Hx of: hx of sinusitis , has AR too - not taking anything currently - used flonase in the past    ROS: See pertinent positives and negatives per HPI.  Past Medical History  Diagnosis Date  . ALLERGIC RHINITIS 05/18/2007  . Headache 03/03/2007  . PARESTHESIA 03/03/2007  . Sinusitis 09/24/2010    Family History  Problem Relation Age of Onset  . Hyperlipidemia Neg Hx     family hx  . Hypertension Neg Hx     family hx    History   Social History  . Marital Status: Married    Spouse Name: N/A    Number of Children: N/A  . Years of Education: N/A   Social History Main Topics  . Smoking status: Never Smoker   . Smokeless tobacco: Never Used  . Alcohol Use: Yes  . Drug Use: No  . Sexually Active: None   Other Topics Concern  . None   Social History Narrative  . None    Current outpatient prescriptions:ALPRAZolam (XANAX) 0.25 MG tablet, TAKE 1 TABLET EVERY NIGHT AS NEEDED FOR ANXIETY AND SLEEP, Disp: 30 tablet, Rfl: 2;  fluticasone (FLONASE) 50 MCG/ACT nasal spray, 2 sprays each nostril each day, Disp: 16 g, Rfl: 1;  guaiFENesin-codeine (ROBITUSSIN AC) 100-10 MG/5ML syrup, Take 5 mLs by mouth 3 (three) times daily as needed for cough., Disp: 120 mL, Rfl: 0  EXAM:  Filed Vitals:   05/29/12 1122  BP: 108/70  Pulse: 75  Temp: 97.6 F (36.4 C)    Body mass index is 21.30 kg/(m^2).  GENERAL: vitals reviewed and listed above, alert, oriented, appears well hydrated and in no acute distress  HEENT: atraumatic, conjunttiva clear, no obvious abnormalities on inspection of external  nose and ears, normal appearance of ear canals and TMs, clear nasal congestion, mild post oropharyngeal erythema with cobblestoning and PND, no tonsillar edema or exudate, no sinus TTP  NECK: no obvious masses on inspection  LUNGS: clear to auscultation bilaterally, no wheezes, rales or rhonchi, good air movement  CV: HRRR, no peripheral edema  MS: moves all extremities without noticeable abnormality  PSYCH: pleasant and cooperative, no obvious depression or anxiety  ASSESSMENT AND PLAN:  Discussed the following assessment and plan:  1. ALLERGIC RHINITIS  guaiFENesin-codeine (ROBITUSSIN AC) 100-10 MG/5ML syrup, fluticasone (FLONASE) 50 MCG/ACT nasal spray  2. Upper respiratory infection  guaiFENesin-codeine (ROBITUSSIN AC) 100-10 MG/5ML syrup   -exam more c/w AR or VURI - less likely bacterial sinusitis -recs per below and return precuations -Patient advised to return or notify a doctor immediately if symptoms worsen or persist or new concerns arise.  Patient Instructions  INSTRUCTIONS FOR UPPER RESPIRATORY INFECTION:  -plenty of rest and fluids  -start the claritin and flonase  -nasal saline wash 2-3 times daily (use prepackaged nasal saline or bottled/distilled water if making your own)   -clean nose with nasal saline before using the nasal steroid or sinex  -can use sinex nasal spray for drainage and nasal congestion - but do NOT use longer then 3-4 days  -can use tylenol  or ibuprofen as directed for aches and sorethroat  -in the winter time, using a humidifier at night is helpful (please follow cleaning instructions)  -if you are taking a cough medication - use only as directed, may also try a teaspoon of honey to coat the throat and throat lozenges  -for sore throat, salt water gargles can help  -follow up if you have fevers, facial pain, tooth pain, difficulty breathing or are worsening or not getting better in 5-7 days      Robyn Medina, Jarrett Soho R.

## 2012-05-29 NOTE — Patient Instructions (Addendum)
INSTRUCTIONS FOR UPPER RESPIRATORY INFECTION:  -plenty of rest and fluids  -start the claritin and flonase  -nasal saline wash 2-3 times daily (use prepackaged nasal saline or bottled/distilled water if making your own)   -clean nose with nasal saline before using the nasal steroid or sinex  -can use sinex nasal spray for drainage and nasal congestion - but do NOT use longer then 3-4 days  -can use tylenol or ibuprofen as directed for aches and sorethroat  -in the winter time, using a humidifier at night is helpful (please follow cleaning instructions)  -if you are taking a cough medication - use only as directed, may also try a teaspoon of honey to coat the throat and throat lozenges  -for sore throat, salt water gargles can help  -follow up if you have fevers, facial pain, tooth pain, difficulty breathing or are worsening or not getting better in 5-7 days

## 2012-06-13 ENCOUNTER — Telehealth: Payer: Self-pay | Admitting: Internal Medicine

## 2012-06-13 NOTE — Telephone Encounter (Signed)
Pt is requesting cpx with NP if ok with Dr Lovell Sheehan pt does not want to wait until summer. Pt would like to have cpx labs and b12 level included with order. Can I sch?

## 2012-06-13 NOTE — Telephone Encounter (Signed)
Yes that is fine

## 2012-06-13 NOTE — Telephone Encounter (Signed)
Ok with me 

## 2012-06-16 NOTE — Telephone Encounter (Signed)
Yes you may add b12 level to cpx labs- thanks

## 2012-06-16 NOTE — Telephone Encounter (Signed)
Can I add b12 level to cpx labs?

## 2012-06-16 NOTE — Telephone Encounter (Signed)
Pt cpx and labwork has been sch

## 2012-06-21 ENCOUNTER — Other Ambulatory Visit (INDEPENDENT_AMBULATORY_CARE_PROVIDER_SITE_OTHER): Payer: No Typology Code available for payment source

## 2012-06-21 DIAGNOSIS — Z0000 Encounter for general adult medical examination without abnormal findings: Secondary | ICD-10-CM

## 2012-06-21 LAB — POCT URINALYSIS DIPSTICK
Glucose, UA: NEGATIVE
Ketones, UA: NEGATIVE
Nitrite, UA: NEGATIVE
Spec Grav, UA: 1.02
Urobilinogen, UA: 1
pH, UA: 6.5

## 2012-06-21 LAB — BASIC METABOLIC PANEL
BUN: 10 mg/dL (ref 6–23)
CO2: 26 mEq/L (ref 19–32)
Calcium: 9 mg/dL (ref 8.4–10.5)
Chloride: 104 mEq/L (ref 96–112)
Creatinine, Ser: 0.8 mg/dL (ref 0.4–1.2)
GFR: 79.04 mL/min (ref >60.00)
Glucose, Bld: 82 mg/dL (ref 70–99)
Potassium: 4.3 mEq/L (ref 3.5–5.1)
Sodium: 136 mEq/L (ref 135–145)

## 2012-06-21 LAB — HEPATIC FUNCTION PANEL
ALT: 13 U/L (ref 0–35)
AST: 15 U/L (ref 0–37)
Albumin: 3.8 g/dL (ref 3.5–5.2)
Alkaline Phosphatase: 47 U/L (ref 39–117)
Bilirubin, Direct: 0 mg/dL (ref 0.0–0.3)
Total Bilirubin: 0.6 mg/dL (ref 0.3–1.2)
Total Protein: 7.1 g/dL (ref 6.0–8.3)

## 2012-06-21 LAB — CBC WITH DIFFERENTIAL/PLATELET
Basophils Absolute: 0.1 10*3/uL (ref 0.0–0.1)
Basophils Relative: 1.1 % (ref 0.0–3.0)
Eosinophils Absolute: 0.4 10*3/uL (ref 0.0–0.7)
Eosinophils Relative: 5.9 % — ABNORMAL HIGH (ref 0.0–5.0)
HCT: 40.9 % (ref 36.0–46.0)
Hemoglobin: 13.9 g/dL (ref 12.0–15.0)
Lymphocytes Relative: 30.1 % (ref 12.0–46.0)
Lymphs Abs: 2.1 10*3/uL (ref 0.7–4.0)
MCHC: 34 g/dL (ref 30.0–36.0)
MCV: 90.1 fl (ref 78.0–100.0)
Monocytes Absolute: 0.4 10*3/uL (ref 0.1–1.0)
Monocytes Relative: 5.4 % (ref 3.0–12.0)
Neutro Abs: 4 10*3/uL (ref 1.4–7.7)
Neutrophils Relative %: 57.5 % (ref 43.0–77.0)
Platelets: 257 10*3/uL (ref 150.0–400.0)
RBC: 4.54 Mil/uL (ref 3.87–5.11)
RDW: 12.6 % (ref 11.5–14.6)
WBC: 6.9 10*3/uL (ref 4.5–10.5)

## 2012-06-21 LAB — LDL CHOLESTEROL, DIRECT: Direct LDL: 149.8 mg/dL

## 2012-06-21 LAB — VITAMIN B12: Vitamin B-12: 313 pg/mL (ref 211–911)

## 2012-06-21 LAB — LIPID PANEL
Cholesterol: 229 mg/dL — ABNORMAL HIGH (ref 0–200)
HDL: 54.4 mg/dL (ref >39.00)
Total CHOL/HDL Ratio: 4
Triglycerides: 138 mg/dL (ref 0.0–149.0)
VLDL: 27.6 mg/dL (ref 0.0–40.0)

## 2012-06-21 LAB — TSH: TSH: 0.79 u[IU]/mL (ref 0.35–5.50)

## 2012-06-22 ENCOUNTER — Ambulatory Visit: Payer: No Typology Code available for payment source | Admitting: Psychology

## 2012-06-26 ENCOUNTER — Encounter: Payer: Self-pay | Admitting: Family

## 2012-06-26 ENCOUNTER — Ambulatory Visit (INDEPENDENT_AMBULATORY_CARE_PROVIDER_SITE_OTHER): Payer: No Typology Code available for payment source | Admitting: Family

## 2012-06-26 VITALS — BP 110/80 | HR 71 | Ht 64.5 in | Wt 124.0 lb

## 2012-06-26 DIAGNOSIS — F329 Major depressive disorder, single episode, unspecified: Secondary | ICD-10-CM

## 2012-06-26 DIAGNOSIS — Z0000 Encounter for general adult medical examination without abnormal findings: Secondary | ICD-10-CM

## 2012-06-26 DIAGNOSIS — Z1231 Encounter for screening mammogram for malignant neoplasm of breast: Secondary | ICD-10-CM

## 2012-06-26 DIAGNOSIS — Z136 Encounter for screening for cardiovascular disorders: Secondary | ICD-10-CM

## 2012-06-26 NOTE — Progress Notes (Signed)
Subjective:    Patient ID: Robyn Medina, female    DOB: [birth year], 47 y.o.   MRN: [id number]  HPI This is a routine physical examination for this healthy  Female. Reviewed all health maintenance protocols including mammography colonoscopy bone density and reviewed appropriate screening labs. Her immunization history was reviewed as well as her current medications and allergies refills of her chronic medications were given and the plan for yearly health maintenance was discussed all orders and referrals were made as appropriate.   Review of Systems  Constitutional: Negative.   HENT: Negative.   Eyes: Negative.   Respiratory: Negative.   Cardiovascular: Negative.   Gastrointestinal: Negative.   Endocrine: Negative.   Genitourinary: Negative.   Musculoskeletal: Negative.   Skin: Negative.   Allergic/Immunologic: Negative.   Neurological: Negative.   Hematological: Negative.   Psychiatric/Behavioral: Negative.    Past Medical History  Diagnosis Date  . ALLERGIC RHINITIS 05/18/2007  . Headache 03/03/2007  . PARESTHESIA 03/03/2007  . Sinusitis 09/24/2010    History   Social History  . Marital Status: Married    Spouse Name: N/A    Number of Children: N/A  . Years of Education: N/A   Occupational History  . Not on file.   Social History Main Topics  . Smoking status: Never Smoker   . Smokeless tobacco: Never Used  . Alcohol Use: Yes  . Drug Use: No  . Sexually Active: Not on file   Other Topics Concern  . Not on file   Social History Narrative  . No narrative on file    History reviewed. No pertinent past surgical history.  Family History  Problem Relation Age of Onset  . Hyperlipidemia Neg Hx     family hx  . Hypertension Neg Hx     family hx    No Known Allergies  Current Outpatient Prescriptions on File Prior to Visit  Medication Sig Dispense Refill  . ALPRAZolam (XANAX) 0.25 MG tablet TAKE 1 TABLET EVERY NIGHT AS NEEDED FOR ANXIETY AND SLEEP  30 tablet   2  . fluticasone (FLONASE) 50 MCG/ACT nasal spray 2 sprays each nostril each day  16 g  1  . guaiFENesin-codeine (ROBITUSSIN AC) 100-10 MG/5ML syrup Take 5 mLs by mouth 3 (three) times daily as needed for cough.  120 mL  0   No current facility-administered medications on file prior to visit.    BP 110/80  Pulse 71  Ht 5' 4.5" (1.638 m)  Wt 124 lb (56.246 kg)  BMI 20.96 kg/m2  SpO2 98%  LMP 01/29/2014chart    Objective:   Physical Exam  Constitutional: She is oriented to person, place, and time. She appears well-developed and well-nourished.  HENT:  Right Ear: External ear normal.  Left Ear: External ear normal.  Nose: Nose normal.  Mouth/Throat: Oropharynx is clear and moist.  Eyes: Conjunctivae are normal. Pupils are equal, round, and reactive to light.  Neck: Normal range of motion. Neck supple.  Cardiovascular: Normal rate, regular rhythm and normal heart sounds.   Pulmonary/Chest: Effort normal and breath sounds normal.  Abdominal: Soft. Bowel sounds are normal.  Genitourinary:  Deferred to GYN  Musculoskeletal: Normal range of motion.  Neurological: She is alert and oriented to person, place, and time. She has normal reflexes. She displays normal reflexes. No cranial nerve deficit. Coordination normal.  Skin: Skin is warm and dry.  Psychiatric: She has a normal mood and affect.          Assessment &  Plan:  Assessment:  1. CPX 2. Depression   Plan: Encouraged healthy diet, exercise, monthly self breast exams. We'll schedule mammogram. Patient will continue to see gynecology as scheduled. Call the office with any questions or concerns. Recheck in 6 months and sooner as needed.

## 2012-07-19 ENCOUNTER — Other Ambulatory Visit: Payer: Self-pay | Admitting: Internal Medicine

## 2012-07-26 ENCOUNTER — Ambulatory Visit
Admission: RE | Admit: 2012-07-26 | Discharge: 2012-07-26 | Disposition: A | Discharge status: Home / Self Care | Payer: PRIVATE HEALTH INSURANCE | Source: Ambulatory Visit | Attending: Family | Admitting: Family

## 2012-07-26 ENCOUNTER — Ambulatory Visit: Payer: No Typology Code available for payment source

## 2012-07-26 DIAGNOSIS — Z1231 Encounter for screening mammogram for malignant neoplasm of breast: Secondary | ICD-10-CM

## 2013-01-09 ENCOUNTER — Encounter: Payer: Self-pay | Admitting: Internal Medicine

## 2013-01-09 ENCOUNTER — Ambulatory Visit (INDEPENDENT_AMBULATORY_CARE_PROVIDER_SITE_OTHER): Payer: PRIVATE HEALTH INSURANCE | Admitting: Internal Medicine

## 2013-01-09 VITALS — BP 90/60 | HR 78 | Temp 98.6°F | Resp 18 | Wt 131.0 lb

## 2013-01-09 DIAGNOSIS — M7062 Trochanteric bursitis, left hip: Secondary | ICD-10-CM

## 2013-01-09 DIAGNOSIS — Z23 Encounter for immunization: Secondary | ICD-10-CM

## 2013-01-09 DIAGNOSIS — M76899 Other specified enthesopathies of unspecified lower limb, excluding foot: Secondary | ICD-10-CM

## 2013-01-09 MED ORDER — PREDNISONE 10 MG PO TABS: 10.0000 mg | ORAL_TABLET | Freq: Two times a day (BID) | ORAL | Status: DC

## 2013-01-09 NOTE — Progress Notes (Signed)
Subjective:    Patient ID: Robyn Medina, female    DOB: 03-23-1966, 47 y.o.   MRN: [id number]  HPI  47 year old patient who presents with a two-month history of increasing left hip pain. Pain has been fairly constant but yesterday he became much more severe. She has had the frequent exacerbations over the past 2 months. She describes pain in the left lateral hip and anterolateral left thigh area.  Past Medical History  Diagnosis Date  . ALLERGIC RHINITIS 05/18/2007  . Headache(784.0) 03/03/2007  . PARESTHESIA 03/03/2007  . Sinusitis 09/24/2010    History   Social History  . Marital Status: Married    Spouse Name: N/A    Number of Children: N/A  . Years of Education: N/A   Occupational History  . Not on file.   Social History Main Topics  . Smoking status: Never Smoker   . Smokeless tobacco: Never Used  . Alcohol Use: Yes  . Drug Use: No  . Sexual Activity: Not on file   Other Topics Concern  . Not on file   Social History Narrative  . No narrative on file    History reviewed. No pertinent past surgical history.  Family History  Problem Relation Age of Onset  . Hyperlipidemia Neg Hx     family hx  . Hypertension Neg Hx     family hx    No Known Allergies  Current Outpatient Prescriptions on File Prior to Visit  Medication Sig Dispense Refill  . fluticasone (FLONASE) 50 MCG/ACT nasal spray 2 sprays each nostril each day  16 g  1  . Norgestimate-Ethinyl Estradiol Triphasic 0.18/0.215/0.25 MG-35 MCG tablet Take 1 tablet by mouth daily.       Marland Kitchen ALPRAZolam (XANAX) 0.25 MG tablet TAKE 1 TABLET EVERY NIGHT AS NEEDED FOR ANXIETY AND SLEEP  30 tablet  2  . citalopram (CELEXA) 20 MG tablet take 1 tablet by mouth once daily  30 tablet  6   No current facility-administered medications on file prior to visit.    BP 90/60  Pulse 78  Temp(Src) 98.6 F (37 C) (Oral)  Resp 18  Wt 131 lb (59.421 kg)  BMI 22.15 kg/m2  SpO2 98%  LMP 01/03/2013       Review of  Systems  Constitutional: Negative.   HENT: Negative for hearing loss, congestion, sore throat, rhinorrhea, dental problem, sinus pressure and tinnitus.   Eyes: Negative for pain, discharge and visual disturbance.  Respiratory: Negative for cough and shortness of breath.   Cardiovascular: Negative for chest pain, palpitations and leg swelling.  Gastrointestinal: Negative for nausea, vomiting, abdominal pain, diarrhea, constipation, blood in stool and abdominal distention.  Genitourinary: Negative for dysuria, urgency, frequency, hematuria, flank pain, vaginal bleeding, vaginal discharge, difficulty urinating, vaginal pain and pelvic pain.  Musculoskeletal: Positive for myalgias and gait problem. Negative for joint swelling and arthralgias.  Skin: Negative for rash.  Neurological: Negative for dizziness, syncope, speech difficulty, weakness, numbness and headaches.  Hematological: Negative for adenopathy.  Psychiatric/Behavioral: Negative for behavioral problems, dysphoric mood and agitation. The patient is not nervous/anxious.        Objective:   Physical Exam  Constitutional: She appears well-developed and well-nourished. No distress.  Musculoskeletal:  Tenderness left lateral hip that reproduces her discomfort Negative straight leg test  Neurological:  Normal gait Able to walk on toes and heels without difficulty Patellar and Achilles reflexes brisk and equal          Assessment & Plan:  Left hip bursitis.   Procedure note. After alcohol and Betadine prep and consent, 1 cc (80 mg) of Depo-Medrol  with the lidocaine injected subcutaneously about the left greater trochanter. Patient tolerated the procedure well

## 2013-01-09 NOTE — Patient Instructions (Addendum)

## 2013-01-15 ENCOUNTER — Telehealth: Payer: Self-pay | Admitting: Internal Medicine

## 2013-01-15 MED ORDER — METHYLPREDNISOLONE 4 MG PO KIT: PACK | ORAL | Status: DC

## 2013-01-15 NOTE — Telephone Encounter (Signed)
Per dr jenkins- may have medrol dose pack 

## 2013-01-15 NOTE — Telephone Encounter (Signed)
Rx sent and patient is aware. 

## 2013-01-15 NOTE — Telephone Encounter (Signed)
Patient Information:  Caller Name: Eunice Blase  Phone: [phone]  Patient: Robyn Medina  Gender: Female  DOB: 08-10-1965  Age: 47 Years  PCP: Eleonore Chiquito (Family Practice > 17yrs old)  Pregnant: No  Office Follow Up:  Does the office need to follow up with this patient?: Yes  Instructions For The Office: Office please follow up with pt.  She is requesting a RX of Prednisone to be called in for her to the [facility] in [place]  RN Note:  Pt is taking Alleve for the pain and that is keeping it managable.  pt is able to walk  Symptoms  Reason For Call & Symptoms: pt states her pain starts in her back and goes down her hip and left leg. Pt has had these symptoms x 2 months Pt was seenin the office for this on 01/09/13 and received a cortisone shot. Pt states the shot helped her hip but did not help her leg.  Pt is calling to request a RX of Prednisone to be called in for her  Reviewed Health History In EMR: Yes  Reviewed Medications In EMR: Yes  Reviewed Allergies In EMR: Yes  Reviewed Surgeries / Procedures: Yes  Date of Onset of Symptoms: Unknown OB / GYN:  LMP: 01/10/2013  Guideline(s) Used:  Leg Pain  Disposition Per Guideline:   See Within 2 Weeks in Office  Reason For Disposition Reached:   Leg pain or muscle cramp is a chronic symptom (recurrent or ongoing AND lasting > 4 weeks)  Advice Given:  N/A  Patient Refused Recommendation:  Patient Requests Prescription  pt is requesting a RX

## 2013-02-25 ENCOUNTER — Emergency Department (HOSPITAL_COMMUNITY)
Admission: EM | Admit: 2013-02-25 | Discharge: 2013-02-25 | Disposition: A | Discharge status: Home / Self Care | Payer: PRIVATE HEALTH INSURANCE | Attending: Emergency Medicine | Admitting: Emergency Medicine

## 2013-02-25 ENCOUNTER — Encounter (HOSPITAL_COMMUNITY): Payer: Self-pay | Admitting: Emergency Medicine

## 2013-02-25 ENCOUNTER — Emergency Department (HOSPITAL_COMMUNITY): Payer: PRIVATE HEALTH INSURANCE

## 2013-02-25 DIAGNOSIS — R197 Diarrhea, unspecified: Secondary | ICD-10-CM | POA: Insufficient documentation

## 2013-02-25 DIAGNOSIS — Z3202 Encounter for pregnancy test, result negative: Secondary | ICD-10-CM | POA: Insufficient documentation

## 2013-02-25 DIAGNOSIS — R110 Nausea: Secondary | ICD-10-CM | POA: Insufficient documentation

## 2013-02-25 DIAGNOSIS — M549 Dorsalgia, unspecified: Secondary | ICD-10-CM | POA: Insufficient documentation

## 2013-02-25 DIAGNOSIS — R0789 Other chest pain: Secondary | ICD-10-CM | POA: Insufficient documentation

## 2013-02-25 DIAGNOSIS — Z8709 Personal history of other diseases of the respiratory system: Secondary | ICD-10-CM | POA: Insufficient documentation

## 2013-02-25 DIAGNOSIS — R109 Unspecified abdominal pain: Secondary | ICD-10-CM | POA: Insufficient documentation

## 2013-02-25 DIAGNOSIS — Z7982 Long term (current) use of aspirin: Secondary | ICD-10-CM | POA: Insufficient documentation

## 2013-02-25 DIAGNOSIS — Z79899 Other long term (current) drug therapy: Secondary | ICD-10-CM | POA: Insufficient documentation

## 2013-02-25 DIAGNOSIS — R079 Chest pain, unspecified: Secondary | ICD-10-CM

## 2013-02-25 LAB — URINALYSIS, ROUTINE W REFLEX MICROSCOPIC
Bilirubin Urine: NEGATIVE
Glucose, UA: NEGATIVE mg/dL
Hgb urine dipstick: NEGATIVE
Ketones, ur: NEGATIVE mg/dL
Leukocytes, UA: NEGATIVE
Nitrite: NEGATIVE
Protein, ur: NEGATIVE mg/dL
Specific Gravity, Urine: 1.02 (ref 1.005–1.030)
Urobilinogen, UA: 0.2 mg/dL (ref 0.0–1.0)
pH: 8.5 — ABNORMAL HIGH (ref 5.0–8.0)

## 2013-02-25 LAB — CBC WITH DIFFERENTIAL/PLATELET
Basophils Absolute: 0.1 10*3/uL (ref 0.0–0.1)
Basophils Relative: 1 % (ref 0–1)
Eosinophils Absolute: 0.5 10*3/uL (ref 0.0–0.7)
Eosinophils Relative: 6 % — ABNORMAL HIGH (ref 0–5)
HCT: 40.6 % (ref 36.0–46.0)
Hemoglobin: 13.8 g/dL (ref 12.0–15.0)
Lymphocytes Relative: 27 % (ref 12–46)
Lymphs Abs: 2.4 10*3/uL (ref 0.7–4.0)
MCH: 31.3 pg (ref 26.0–34.0)
MCHC: 34 g/dL (ref 30.0–36.0)
MCV: 92.1 fL (ref 78.0–100.0)
Monocytes Absolute: 0.5 10*3/uL (ref 0.1–1.0)
Monocytes Relative: 6 % (ref 3–12)
Neutro Abs: 5.3 10*3/uL (ref 1.7–7.7)
Neutrophils Relative %: 60 % (ref 43–77)
Platelets: 280 10*3/uL (ref 150–400)
RBC: 4.41 MIL/uL (ref 3.87–5.11)
RDW: 12.2 % (ref 11.5–15.5)
WBC: 8.8 10*3/uL (ref 4.0–10.5)

## 2013-02-25 LAB — COMPREHENSIVE METABOLIC PANEL
ALT: 12 U/L (ref 0–35)
AST: 22 U/L (ref 0–37)
Albumin: 3.6 g/dL (ref 3.5–5.2)
Alkaline Phosphatase: 51 U/L (ref 39–117)
BUN: 13 mg/dL (ref 6–23)
CO2: 27 mEq/L (ref 19–32)
Calcium: 9.4 mg/dL (ref 8.4–10.5)
Chloride: 101 mEq/L (ref 96–112)
Creatinine, Ser: 0.74 mg/dL (ref 0.50–1.10)
GFR calc Af Amer: >90 mL/min (ref >90)
GFR calc non Af Amer: >90 mL/min (ref >90)
Glucose, Bld: 128 mg/dL — ABNORMAL HIGH (ref 70–99)
Potassium: 3.9 mEq/L (ref 3.5–5.1)
Sodium: 136 mEq/L (ref 135–145)
Total Bilirubin: 0.4 mg/dL (ref 0.3–1.2)
Total Protein: 7.3 g/dL (ref 6.0–8.3)

## 2013-02-25 LAB — TROPONIN I: Troponin I: <0.3 ng/mL (ref <0.30)

## 2013-02-25 LAB — POCT PREGNANCY, URINE: Preg Test, Ur: NEGATIVE

## 2013-02-25 LAB — LIPASE, BLOOD: Lipase: 34 U/L (ref 11–59)

## 2013-02-25 MED ORDER — ONDANSETRON HCL 4 MG/2ML IJ SOLN
4.0000 mg | Freq: Once | INTRAMUSCULAR | Status: AC
  Administered 2013-02-25: 4 mg via INTRAVENOUS
  Filled 2013-02-25: qty 2

## 2013-02-25 MED ORDER — OXYCODONE-ACETAMINOPHEN 5-325 MG PO TABS: 1.0000 | ORAL_TABLET | Freq: Four times a day (QID) | ORAL | Status: DC | PRN

## 2013-02-25 MED ORDER — MORPHINE SULFATE 4 MG/ML IJ SOLN
4.0000 mg | Freq: Once | INTRAMUSCULAR | Status: DC
  Administered 2013-02-25: 2 mg via INTRAVENOUS
  Filled 2013-02-25: qty 1

## 2013-02-25 MED ORDER — SODIUM CHLORIDE 0.9 % IV BOLUS (SEPSIS)
500.0000 mL | Freq: Once | INTRAVENOUS | Status: AC
  Administered 2013-02-25: 500 mL via INTRAVENOUS

## 2013-02-25 MED ORDER — ONDANSETRON 8 MG PO TBDP: 8.0000 mg | ORAL_TABLET | Freq: Three times a day (TID) | ORAL | Status: DC | PRN

## 2013-02-25 MED ORDER — MORPHINE SULFATE 2 MG/ML IJ SOLN: 2.0000 mg | Freq: Once | INTRAMUSCULAR | Status: DC

## 2013-02-25 NOTE — ED Notes (Signed)
Notified EDP of pt's BP, decreased morphine to 2mg  instead of 4mg .

## 2013-02-25 NOTE — ED Notes (Signed)
Patient with no complaints at this time. Respirations even and unlabored. Skin warm/dry. Discharge instructions reviewed with patient at this time. Patient given opportunity to voice concerns/ask questions. IV removed per policy and band-aid applied to site. Patient discharged at this time and left Emergency Department with steady gait.  

## 2013-02-25 NOTE — ED Notes (Signed)
Pt states she was sitting on her couch when she became nauseated and began having generalized chest pain. Pt describes pain as tightness. Pt also reports "some" SOB since symptoms began. Nad.

## 2013-02-25 NOTE — ED Provider Notes (Signed)
CSN: [id number]     Arrival date & time 02/25/13  1357 History  This chart was scribed for [employer]. Rubin Payor, MD by Valera Castle, ED Scribe. This patient was seen in room APA02/APA02 and the patient's care was started at 2:30 PM.    Chief Complaint  Patient presents with  . Chest Pain   The history is provided by the patient and a relative.   HPI Comments: Robyn Medina is a 47 y.o. female who presents to the Emergency Department complaining of sudden, moderate, constant chest pain, that radiates to the surrounding area and her back, onset earlier this morning. She reports associated nausea and diarrhea. She states she was feeling fine yesterday. She denies h/o similar symptoms. She denies emesis, fever, SOB, appetite change, and any other associated symptoms. She denies any allergies. She reports occasional EtOH use, but denies h/o smoking. She reports her dad's family has a h/o heart problems.   Past Medical History  Diagnosis Date  . ALLERGIC RHINITIS 05/18/2007  . Headache(784.0) 03/03/2007  . PARESTHESIA 03/03/2007  . Sinusitis 09/24/2010   History reviewed. No pertinent past surgical history. Family History  Problem Relation Age of Onset  . Hyperlipidemia Neg Hx     family hx  . Hypertension Neg Hx     family hx   History  Substance Use Topics  . Smoking status: Never Smoker   . Smokeless tobacco: Never Used  . Alcohol Use: Yes   OB History   Grav Para Term Preterm Abortions TAB SAB Ect Mult Living                 Review of Systems  Constitutional: Negative for fever and appetite change.  Respiratory: Negative for shortness of breath.   Cardiovascular: Positive for chest pain.  Gastrointestinal: Positive for nausea and diarrhea. Negative for vomiting.  Musculoskeletal: Positive for back pain.  All other systems reviewed and are negative.    Allergies  Review of patient's allergies indicates no known allergies.  Home Medications   Current Outpatient Rx  Name   Route  Sig  Dispense  Refill  . aspirin EC 81 MG tablet   Oral   Take 81 mg by mouth once.         . Norgestimate-Ethinyl Estradiol Triphasic 0.18/0.215/0.25 MG-35 MCG tablet   Oral   Take 1 tablet by mouth daily.          . ondansetron (ZOFRAN-ODT) 8 MG disintegrating tablet   Oral   Take 1 tablet (8 mg total) by mouth every 8 (eight) hours as needed for nausea.   10 tablet   0   . oxyCODONE-acetaminophen (PERCOCET/ROXICET) 5-325 MG per tablet   Oral   Take 1-2 tablets by mouth every 6 (six) hours as needed for pain.   8 tablet   0    Triage Vitals: BP 112/72  Pulse 74  Temp(Src) 97.6 F (36.4 C) (Oral)  Resp 18  SpO2 100%  LMP 01/25/2013  Physical Exam  Nursing note and vitals reviewed. Constitutional: She is oriented to person, place, and time. She appears well-developed and well-nourished. No distress.  HENT:  Head: Normocephalic and atraumatic.  Eyes: EOM are normal.  Cardiovascular: Normal rate, regular rhythm and normal heart sounds.  Exam reveals no gallop and no friction rub.   No murmur heard. Pulmonary/Chest: Effort normal and breath sounds normal. No respiratory distress. She has no wheezes. She has no rales. She exhibits tenderness.  Abdominal: Soft. There is tenderness.  There is no rebound and no guarding.  Mild tenderness to palpation over lower chest and upper abdomen.  Genitourinary:  No hernia.  Neurological: She is alert and oriented to person, place, and time.  Skin: Skin is warm and dry. No rash noted.  Psychiatric: She has a normal mood and affect. Her behavior is normal.    ED Course  Procedures (including critical care time)  COORDINATION OF CARE: 2:33 PM-Discussed treatment plan which includes EKG, sodium chloride bolus, CXR, CBC, CMP, LIpase, UA, pregnancy test, and Troponin with pt at bedside and pt agreed to plan.   Labs Review Labs Reviewed  CBC WITH DIFFERENTIAL - Abnormal; Notable for the following:    Eosinophils Relative 6  (*)    All other components within normal limits  COMPREHENSIVE METABOLIC PANEL - Abnormal; Notable for the following:    Glucose, Bld 128 (*)    All other components within normal limits  URINALYSIS, ROUTINE W REFLEX MICROSCOPIC - Abnormal; Notable for the following:    pH 8.5 (*)    All other components within normal limits  TROPONIN I  LIPASE, BLOOD  POCT PREGNANCY, URINE   Imaging Review Dg Chest 2 View  02/25/2013   CLINICAL DATA:  Chest pain.  EXAM: CHEST  2 VIEW  COMPARISON:  None.  FINDINGS: The heart size and mediastinal contours are within normal limits. Both lungs are clear. The visualized skeletal structures are unremarkable.  IMPRESSION: No active cardiopulmonary disease.   Electronically Signed   By: Richarda Overlie M.D.   On: 02/25/2013 15:29    EKG Interpretation     Ventricular Rate:  77 PR Interval:  132 QRS Duration: 76 QT Interval:  380 QTC Calculation: 430 R Axis:   21 Text Interpretation:  Normal sinus rhythm Normal ECG No previous ECGs available            MDM   1. Chest pain   2. Abdominal pain    Patient presents with chest pain and abdominal pain. EKG normal. Laboratory reassuring. No cardiac cause. Mild tenderness. Patient feels better after treatment of pain medicine. Nausea is resolved. She's had some diarrhea also. Will discharge home with     I personally performed the services described in this documentation, which was scribed in my presence. The recorded information has been reviewed and is accurate.     Juliet Rude. Rubin Payor, MD 02/25/13 1900

## 2013-02-25 NOTE — ED Notes (Addendum)
Pt c/o mid center chest pain that radiates to bilateral arms and around to back area that has remained constant since this am, pain is associated with nausea, sob, and diaphoresis,

## 2013-02-26 ENCOUNTER — Encounter: Payer: Self-pay | Admitting: Gastroenterology

## 2013-02-26 ENCOUNTER — Telehealth: Payer: Self-pay | Admitting: Internal Medicine

## 2013-02-26 NOTE — Telephone Encounter (Signed)
Patient Information:  Caller Name: Eunice Blase  Phone: [phone]  Patient: Robyn Medina, Robyn Medina  Gender: Female  DOB: 06/14/1965  Age: 47 Years  PCP: Darryll Capers (Adults only)  Pregnant: No  Office Follow Up:  Does the office need to follow up with this patient?: No  Instructions For The Office: N/A  RN Note:  Due to patient f/u from er visit office called and appt scheduled for 02/27/13 1545 pm.  If s/s worsen patient to call back today.  Symptoms  Reason For Call & Symptoms: 02/25/13 woke up sick at her stomach, diarrhea x 1; 1300 when using the bathroom chest and back hurting; Seen in er with cardiac, lungs, gallbladder ruled out; States she has never had bloating so bad that it caused such pain.  Slight discomfort this am after eating.  Reviewed Health History In EMR: Yes  Reviewed Medications In EMR: Yes  Reviewed Allergies In EMR: Yes  Reviewed Surgeries / Procedures: Yes  Date of Onset of Symptoms: 02/25/2013  Treatments Tried: Er visit Pattricia Boss [facility]) Percocet  Treatments Tried Worked: Yes OB / GYN:  LMP: 01/29/2013  Guideline(s) Used:  Abdominal Pain - Female  Disposition Per Guideline:   See Today in Office  Reason For Disposition Reached:   Moderate or mild pain that comes and goes (cramps) lasts > 24 hours  Advice Given:  Reassurance:  A mild stomachache can be caused by indigestion, gas pains or overeating. Sometimes a stomachache signals the onset of a vomiting illness due to a viral gastroenteritis ("stomach flu").  Rest:  Lie down and rest until you feel better.  Fluids:  Sip clear fluids only (e.g., water, flat soft drinks or 1/2 strength fruit juice) until the pain has been gone for over 2 hours. Then slowly return to a regular diet.  Avoid NSAIDS and Aspirin  : Avoid any drug that can irritate the stomach lining and make the pain worse (especially aspirin and NSAIDs like ibuprofen).  Call Back If:  Abdominal pain is constant and present for more than 2 hours  You  become worse.  Patient Will Follow Care Advice:  YES

## 2013-02-27 ENCOUNTER — Ambulatory Visit (INDEPENDENT_AMBULATORY_CARE_PROVIDER_SITE_OTHER): Payer: PRIVATE HEALTH INSURANCE | Admitting: Family

## 2013-02-27 ENCOUNTER — Encounter: Payer: Self-pay | Admitting: Family

## 2013-02-27 VITALS — BP 90/40 | HR 74 | Wt 129.0 lb

## 2013-02-27 DIAGNOSIS — R079 Chest pain, unspecified: Secondary | ICD-10-CM

## 2013-02-27 DIAGNOSIS — R1013 Epigastric pain: Secondary | ICD-10-CM

## 2013-02-27 DIAGNOSIS — K219 Gastro-esophageal reflux disease without esophagitis: Secondary | ICD-10-CM

## 2013-02-27 MED ORDER — OMEPRAZOLE 40 MG PO CPDR: 40.0000 mg | DELAYED_RELEASE_CAPSULE | Freq: Every day | ORAL | Status: DC

## 2013-02-27 NOTE — Progress Notes (Signed)
Subjective:    Patient ID: Robyn Medina, female    DOB: May 03, 1965, 47 y.o.   MRN: [id number]  HPI 47 year old white female, nonsmoker, patient of Dr. Lovell Sheehan his hand is a hospital followup from 02-2013. She presented to the emergency department with chest pain and abdominal pain of unknown etiology. Labs were stable. Reports the pain returned yesterday she describes it as a bloating sensation of pain diffusely across her chest and abdomen. The initial symptoms began after eating very spicy meal. Reports consuming approximately 4-5 cups of caffeine daily. Stress levels are reduced recently. He tried taking Percocet to help the pain to help temporarily. Reports recent hoarseness.   Review of Systems  Constitutional: Negative.   HENT: Negative.   Respiratory: Negative.   Cardiovascular: Negative.   Gastrointestinal: Positive for nausea, abdominal pain and diarrhea. Negative for blood in stool and anal bleeding.  Genitourinary: Negative.   Musculoskeletal: Negative.   Skin: Negative.   Allergic/Immunologic: Negative.   Neurological: Negative.   Psychiatric/Behavioral: Negative.    Past Medical History  Diagnosis Date  . ALLERGIC RHINITIS 05/18/2007  . Headache(784.0) 03/03/2007  . PARESTHESIA 03/03/2007  . Sinusitis 09/24/2010    History   Social History  . Marital Status: Married    Spouse Name: N/A    Number of Children: N/A  . Years of Education: N/A   Occupational History  . Not on file.   Social History Main Topics  . Smoking status: Never Smoker   . Smokeless tobacco: Never Used  . Alcohol Use: Yes  . Drug Use: No  . Sexual Activity: Not on file   Other Topics Concern  . Not on file   Social History Narrative  . No narrative on file    No past surgical history on file.  Family History  Problem Relation Age of Onset  . Hyperlipidemia Neg Hx     family hx  . Hypertension Neg Hx     family hx    No Known Allergies  Current Outpatient Prescriptions on  File Prior to Visit  Medication Sig Dispense Refill  . aspirin EC 81 MG tablet Take 81 mg by mouth once.      . Norgestimate-Ethinyl Estradiol Triphasic 0.18/0.215/0.25 MG-35 MCG tablet Take 1 tablet by mouth daily.       Marland Kitchen oxyCODONE-acetaminophen (PERCOCET/ROXICET) 5-325 MG per tablet Take 1-2 tablets by mouth every 6 (six) hours as needed for pain.  8 tablet  0  . ondansetron (ZOFRAN-ODT) 8 MG disintegrating tablet Take 1 tablet (8 mg total) by mouth every 8 (eight) hours as needed for nausea.  10 tablet  0   No current facility-administered medications on file prior to visit.    BP 90/40  Pulse 74  Wt 129 lb (58.514 kg)  LMP 10/02/2014chart    Objective:   Physical Exam  Constitutional: She is oriented to person, place, and time. She appears well-developed and well-nourished.  Neck: Normal range of motion.  Cardiovascular: Normal rate, regular rhythm and normal heart sounds.   Pulmonary/Chest: Effort normal and breath sounds normal.  Abdominal: Soft. Bowel sounds are normal. She exhibits no distension. There is no tenderness. There is no rebound.  Musculoskeletal: Normal range of motion.  Neurological: She is oriented to person, place, and time.  Skin: Skin is warm and dry.  Psychiatric: She has a normal mood and affect.          Assessment & Plan:  Assessment: 1. GERD 2. Abdominal pain  Plan: Omeprazole  40 mg one tablet daily. Recheck in 2 weeks. Call if symptoms worsen. GERD precautions provided.

## 2013-02-27 NOTE — Patient Instructions (Signed)

## 2013-03-02 ENCOUNTER — Ambulatory Visit: Payer: PRIVATE HEALTH INSURANCE | Admitting: Gastroenterology

## 2013-03-09 ENCOUNTER — Ambulatory Visit (INDEPENDENT_AMBULATORY_CARE_PROVIDER_SITE_OTHER): Payer: PRIVATE HEALTH INSURANCE | Admitting: Gastroenterology

## 2013-03-09 ENCOUNTER — Encounter: Payer: Self-pay | Admitting: Gastroenterology

## 2013-03-09 ENCOUNTER — Other Ambulatory Visit: Payer: PRIVATE HEALTH INSURANCE

## 2013-03-09 VITALS — BP 104/70 | HR 64 | Ht 64.5 in | Wt 127.6 lb

## 2013-03-09 DIAGNOSIS — R140 Abdominal distension (gaseous): Secondary | ICD-10-CM

## 2013-03-09 DIAGNOSIS — R1032 Left lower quadrant pain: Secondary | ICD-10-CM

## 2013-03-09 DIAGNOSIS — K219 Gastro-esophageal reflux disease without esophagitis: Secondary | ICD-10-CM

## 2013-03-09 DIAGNOSIS — R079 Chest pain, unspecified: Secondary | ICD-10-CM

## 2013-03-09 DIAGNOSIS — R141 Gas pain: Secondary | ICD-10-CM

## 2013-03-09 MED ORDER — HYOSCYAMINE SULFATE 0.125 MG SL SUBL: 0.1250 mg | SUBLINGUAL_TABLET | SUBLINGUAL | Status: DC | PRN

## 2013-03-09 MED ORDER — OMEPRAZOLE 40 MG PO CPDR: 40.0000 mg | DELAYED_RELEASE_CAPSULE | Freq: Every day | ORAL | Status: DC

## 2013-03-09 NOTE — Progress Notes (Signed)
History of Present Illness:  This is a very pleasant 47 year old Caucasian female who suddenly 2 weeks ago developed diarrhea nausea followed closely thereafter by severe pain in a band-like pattern across her upper chest radiating into her back with some diaphoresis. She denies typical acid reflux symptoms at that time, presented to the emergency room, and had extensive cardiopulmonary workup that was negative. Was felt that she had acid reflux and she was placed on omeprazole 40 mg a day which she is taking for the last 2 weeks with marked improvement in her chest pain. She has noticed some regurgitation, burning, occasional intermittent dysphagia. Patient not had previous endoscopy, upper GI series, ultrasound exams. She denies any hepatobiliary complaints. Her main complaint is severe bloating which is been present for several years, worse post prandially with high fiber foods, beans, and bread. There is no increased passage of gas, constipation, lower abdominal pain, fever, chills, skin rashes, joint pains, oral stomatitis. She has recently noticed some vague discomfort in her left lower quadrant area. She denies gynecologic issues. There is been no anorexia, weight loss, or symptoms of malabsorption. Her family history is entirely noncontributory. She's not had previous colonoscopy exams.  I have reviewed this patient's present history, medical and surgical past history, allergies and medications.     ROS:   All systems were reviewed and are negative unless otherwise stated in the HPI.    Physical Exam: Blood pressure 104/70, pulse 64 and regular and weight 127 the BMI of 21.57. General well developed well nourished patient in no acute distress, appearing their stated age Eyes PERRLA, no icterus, fundoscopic exam per opthamologist Skin no lesions noted Neck supple, no adenopathy, no thyroid enlargement, no tenderness Chest clear to percussion and auscultation Heart no significant murmurs,  gallops or rubs noted Abdomen no hepatosplenomegaly masses or tenderness, BS normal. There is some tenderness in the left lower quadrant of her sigmoid colon area.  Extremities no acute joint lesions, edema, phlebitis or evidence of cellulitis. Neurologic patient oriented x 3, cranial nerves intact, no focal neurologic deficits noted. Psychological mental status normal and normal affect.  Assessment and plan: New-onset acid reflux disease with what sounds like acid reflux related esophageal spasm. She has not been on antibiotics or other medications that would cause a fungal esophagitis, and she gives no history of pill dysphagia or esophagitis. I've asked continue omeprazole 40 mg a day and explained in antireflux regime with her. Because of the severe nature of her symptoms and some associated dysphagia, I've scheduled endoscopic exam. Her main complaint today however is bloating vague abdominal pain. I have ordered a celiac profile for review, and schedule colonoscopy because of her age and no previous workup. Also prescribe when necessary sublingual Levsin for abdominal spasms and bloating. I suspect this is all related to irritable bowel syndrome. Also vascular to avoid nonabsorbable carbohydrates in her diet and given her a list of these products.

## 2013-03-09 NOTE — Patient Instructions (Addendum)
It has been recommended to you by your physician that you have a(n) Endoscopy/Colonoscopy completed. Per your request, we did not schedule the procedure(s) today. Please contact our office at [phone] should you decide to have the procedure completed.  We have sent the following medications to your pharmacy for you to pick up at your convenience: Omeprazole 40 mg, please take one capsule by mouth once daily  Levsin 0.125 mg, please place one tablet under your tongue every four hours as needed   Your physician has requested that you go to the basement for the following lab work before leaving today: Celiac Panel   Information on GERD is below for your review Information on Artificial Sweeteners was given today for your review   ____________________________________________________________________________________________________________  Diet for Gastroesophageal Reflux Disease, Adult Reflux (acid reflux) is when acid from your stomach flows up into the esophagus. When acid comes in contact with the esophagus, the acid causes irritation and soreness (inflammation) in the esophagus. When reflux happens often or so severely that it causes damage to the esophagus, it is called gastroesophageal reflux disease (GERD). Nutrition therapy can help ease the discomfort of GERD. FOODS OR DRINKS TO AVOID OR LIMIT  Smoking or chewing tobacco. Nicotine is one of the most potent stimulants to acid production in the gastrointestinal tract.  Caffeinated and decaffeinated coffee and black tea.  Regular or low-calorie carbonated beverages or energy drinks (caffeine-free carbonated beverages are allowed).   Strong spices, such as black pepper, white pepper, red pepper, cayenne, curry powder, and chili powder.  Peppermint or spearmint.  Chocolate.  High-fat foods, including meats and fried foods. Extra added fats including oils, butter, salad dressings, and nuts. Limit these to less than 8 tsp per  day.  Fruits and vegetables if they are not tolerated, such as citrus fruits or tomatoes.  Alcohol.  Any food that seems to aggravate your condition. If you have questions regarding your diet, call your caregiver or a registered dietitian. OTHER THINGS THAT MAY HELP GERD INCLUDE:   Eating your meals slowly, in a relaxed setting.  Eating 5 to 6 small meals per day instead of 3 large meals.  Eliminating food for a period of time if it causes distress.  Not lying down until 3 hours after eating a meal.  Keeping the head of your bed raised 6 to 9 inches (15 to 23 cm) by using a foam wedge or blocks under the legs of the bed. Lying flat may make symptoms worse.  Being physically active. Weight loss may be helpful in reducing reflux in overweight or obese adults.  Wear loose fitting clothing EXAMPLE MEAL PLAN This meal plan is approximately 2,000 calories based on https://www.[facility].org/ meal planning guidelines. Breakfast   cup cooked oatmeal.  1 cup strawberries.  1 cup low-fat milk.  1 oz almonds. Snack  1 cup cucumber slices.  6 oz yogurt (made from low-fat or fat-free milk). Lunch  2 slice whole-wheat bread.  2 oz sliced [place].  2 tsp mayonnaise.  1 cup blueberries.  1 cup snap peas. Snack  6 whole-wheat crackers.  1 oz string cheese. Dinner   cup brown rice.  1 cup mixed veggies.  1 tsp olive oil.  3 oz grilled fish. Document Released: 04/12/2005 Document Revised: 07/05/2011 Document Reviewed: 02/26/2011 [facility] Patient Information 2014 [place], [place].

## 2013-03-12 ENCOUNTER — Encounter: Payer: PRIVATE HEALTH INSURANCE | Admitting: Gastroenterology

## 2013-03-13 LAB — CELIAC PANEL 10
Endomysial Screen: NEGATIVE
Gliadin IgA: 4.9 U/mL (ref <20)
Gliadin IgG: 4.7 U/mL (ref <20)
IgA: 196 mg/dL (ref 69–380)
Tissue Transglut Ab: 7.3 U/mL (ref <20)
Tissue Transglutaminase Ab, IgA: 3.7 U/mL (ref <20)

## 2013-06-26 ENCOUNTER — Other Ambulatory Visit: Payer: PRIVATE HEALTH INSURANCE

## 2013-06-29 ENCOUNTER — Other Ambulatory Visit (INDEPENDENT_AMBULATORY_CARE_PROVIDER_SITE_OTHER): Payer: 59

## 2013-06-29 DIAGNOSIS — Z0000 Encounter for general adult medical examination without abnormal findings: Secondary | ICD-10-CM

## 2013-06-29 LAB — LIPID PANEL
Cholesterol: 233 mg/dL — ABNORMAL HIGH (ref 0–200)
HDL: 56.2 mg/dL (ref >39.00)
LDL Cholesterol: 146 mg/dL — ABNORMAL HIGH (ref 0–99)
Total CHOL/HDL Ratio: 4
Triglycerides: 156 mg/dL — ABNORMAL HIGH (ref 0.0–149.0)
VLDL: 31.2 mg/dL (ref 0.0–40.0)

## 2013-06-29 LAB — CBC WITH DIFFERENTIAL/PLATELET
Basophils Absolute: 0.1 10*3/uL (ref 0.0–0.1)
Basophils Relative: 0.7 % (ref 0.0–3.0)
Eosinophils Absolute: 0.6 10*3/uL (ref 0.0–0.7)
Eosinophils Relative: 8.2 % — ABNORMAL HIGH (ref 0.0–5.0)
HCT: 39.2 % (ref 36.0–46.0)
Hemoglobin: 13.3 g/dL (ref 12.0–15.0)
Lymphocytes Relative: 22.7 % (ref 12.0–46.0)
Lymphs Abs: 1.7 10*3/uL (ref 0.7–4.0)
MCHC: 33.9 g/dL (ref 30.0–36.0)
MCV: 90.5 fl (ref 78.0–100.0)
Monocytes Absolute: 0.5 10*3/uL (ref 0.1–1.0)
Monocytes Relative: 6.6 % (ref 3.0–12.0)
Neutro Abs: 4.7 10*3/uL (ref 1.4–7.7)
Neutrophils Relative %: 61.8 % (ref 43.0–77.0)
Platelets: 270 10*3/uL (ref 150.0–400.0)
RBC: 4.33 Mil/uL (ref 3.87–5.11)
RDW: 12.1 % (ref 11.5–14.6)
WBC: 7.6 10*3/uL (ref 4.5–10.5)

## 2013-06-29 LAB — POCT URINALYSIS DIPSTICK
Bilirubin, UA: NEGATIVE
Glucose, UA: NEGATIVE
Leukocytes, UA: NEGATIVE
Nitrite, UA: NEGATIVE
Spec Grav, UA: 1.02
Urobilinogen, UA: 1
pH, UA: 7

## 2013-06-29 LAB — HEPATIC FUNCTION PANEL
ALT: 13 U/L (ref 0–35)
AST: 12 U/L (ref 0–37)
Albumin: 3.8 g/dL (ref 3.5–5.2)
Alkaline Phosphatase: 46 U/L (ref 39–117)
Bilirubin, Direct: 0 mg/dL (ref 0.0–0.3)
Total Bilirubin: 0.8 mg/dL (ref 0.3–1.2)
Total Protein: 6.6 g/dL (ref 6.0–8.3)

## 2013-06-29 LAB — BASIC METABOLIC PANEL
BUN: 13 mg/dL (ref 6–23)
CO2: 29 mEq/L (ref 19–32)
Calcium: 9 mg/dL (ref 8.4–10.5)
Chloride: 105 mEq/L (ref 96–112)
Creatinine, Ser: 0.9 mg/dL (ref 0.4–1.2)
GFR: 76.54 mL/min (ref >60.00)
Glucose, Bld: 78 mg/dL (ref 70–99)
Potassium: 4 mEq/L (ref 3.5–5.1)
Sodium: 137 mEq/L (ref 135–145)

## 2013-06-29 LAB — TSH: TSH: 1.27 u[IU]/mL (ref 0.35–5.50)

## 2013-07-05 ENCOUNTER — Encounter: Payer: Self-pay | Admitting: Family

## 2013-07-05 ENCOUNTER — Ambulatory Visit (INDEPENDENT_AMBULATORY_CARE_PROVIDER_SITE_OTHER): Payer: 59 | Admitting: Family

## 2013-07-05 VITALS — BP 102/62 | HR 99 | Ht 64.0 in | Wt 137.0 lb

## 2013-07-05 DIAGNOSIS — E7800 Pure hypercholesterolemia, unspecified: Secondary | ICD-10-CM

## 2013-07-05 DIAGNOSIS — R5381 Other malaise: Secondary | ICD-10-CM

## 2013-07-05 DIAGNOSIS — Z0000 Encounter for general adult medical examination without abnormal findings: Secondary | ICD-10-CM

## 2013-07-05 DIAGNOSIS — R5383 Other fatigue: Secondary | ICD-10-CM

## 2013-07-05 LAB — VITAMIN B12: Vitamin B-12: 294 pg/mL (ref 211–911)

## 2013-07-05 MED ORDER — OMEPRAZOLE 40 MG PO CPDR: 40.0000 mg | DELAYED_RELEASE_CAPSULE | Freq: Every day | ORAL | Status: DC

## 2013-07-05 NOTE — Progress Notes (Signed)
Subjective:    Patient ID: Robyn Medina, female    DOB: [birth year], 48 y.o.   MRN: [id number]  HPI  48 year old caucasian female, nonsmoker presenting for a routine wellness examination. I reviewed all health maintenance protocols including mammography and colonoscopy  Age and diagnosis  appropriate screening labs were ordered. Her immunization history was reviewed.   The plan for yearly health maintenance was discussed .   Review of Systems  Constitutional: Negative.   HENT: Negative.   Eyes: Negative.   Respiratory: Negative.   Cardiovascular: Negative.   Gastrointestinal: Negative.   Endocrine: Negative.   Genitourinary: Negative.   Musculoskeletal: Negative.   Skin: Negative.   Allergic/Immunologic: Negative.   Neurological: Negative.   Hematological: Negative.   Psychiatric/Behavioral: Negative.    Past Medical History  Diagnosis Date  . ALLERGIC RHINITIS 05/18/2007  . Headache(784.0) 03/03/2007  . PARESTHESIA 03/03/2007  . Sinusitis 09/24/2010    History   Social History  . Marital Status: Married    Spouse Name: N/A    Number of Children: N/A  . Years of Education: N/A   Occupational History  . Not on file.   Social History Main Topics  . Smoking status: Never Smoker   . Smokeless tobacco: Never Used  . Alcohol Use: Yes  . Drug Use: No  . Sexual Activity: Not on file   Other Topics Concern  . Not on file   Social History Narrative  . No narrative on file    History reviewed. No pertinent past surgical history.  Family History  Problem Relation Age of Onset  . Hyperlipidemia Neg Hx     family hx  . Hypertension Neg Hx     family hx  . Heart disease Father   . Heart disease Paternal Uncle     No Known Allergies  Current Outpatient Prescriptions on File Prior to Visit  Medication Sig Dispense Refill  . Norgestimate-Ethinyl Estradiol Triphasic 0.18/0.215/0.25 MG-35 MCG tablet Take 1 tablet by mouth daily.       . hyoscyamine (LEVSIN SL) 0.125  MG SL tablet Place 1 tablet (0.125 mg total) under the tongue every 4 (four) hours as needed.  30 tablet  2  . oxyCODONE-acetaminophen (PERCOCET/ROXICET) 5-325 MG per tablet Take 1-2 tablets by mouth every 6 (six) hours as needed for pain.  8 tablet  0   No current facility-administered medications on file prior to visit.    BP 102/62  Pulse 99  Ht 5\' 4"  (1.626 m)  Wt 137 lb (62.143 kg)  BMI 23.50 kg/m2chart    Objective:   Physical Exam  Constitutional: She is oriented to person, place, and time. She appears well-developed and well-nourished.  HENT:  Head: Normocephalic.  Right Ear: External ear normal.  Left Ear: External ear normal.  Nose: Nose normal.  Mouth/Throat: Oropharynx is clear and moist.  Eyes: Conjunctivae and EOM are normal. Pupils are equal, round, and reactive to light.  Neck: Normal range of motion. Neck supple.  Cardiovascular: Normal rate, regular rhythm and normal heart sounds.   Pulmonary/Chest: Effort normal and breath sounds normal.  Abdominal: Soft. Bowel sounds are normal.  Musculoskeletal: Normal range of motion.  Neurological: She is alert and oriented to person, place, and time. She has normal reflexes.  Skin: Skin is warm and dry.  Psychiatric: She has a normal mood and affect. Her behavior is normal. Judgment and thought content normal.          Assessment &  Plan:  Assessment 1. Annual Physical Examination  Plan 1. Continue current medications. 2. Pap smear scheduled with gynecologist for next week.  3. Schedule mammogram.  4. Obtain lab that include B-12.  5. Encourage exercise and low fat diet..  6. Contact office for any questions or concerns.

## 2013-07-05 NOTE — Patient Instructions (Signed)
Fat and Cholesterol Control Diet  Fat and cholesterol levels in your blood and organs are influenced by your diet. High levels of fat and cholesterol may lead to diseases of the heart, small and large blood vessels, gallbladder, liver, and pancreas.  CONTROLLING FAT AND CHOLESTEROL WITH DIET  Although exercise and lifestyle factors are important, your diet is key. That is because certain foods are known to raise cholesterol and others to lower it. The goal is to balance foods for their effect on cholesterol and more importantly, to replace saturated and trans fat with other types of fat, such as monounsaturated fat, polyunsaturated fat, and omega-3 fatty acids.  On average, a person should consume no more than 15 to 17 g of saturated fat daily. Saturated and trans fats are considered "bad" fats, and they will raise LDL cholesterol. Saturated fats are primarily found in animal products such as meats, butter, and cream. However, that does not mean you need to give up all your favorite foods. Today, there are good tasting, low-fat, low-cholesterol substitutes for most of the things you like to eat. Choose low-fat or nonfat alternatives. Choose round or loin cuts of red meat. These types of cuts are lowest in fat and cholesterol. Chicken (without the skin), fish, veal, and ground turkey breast are great choices. Eliminate fatty meats, such as hot dogs and salami. Even shellfish have little or no saturated fat. Have a 3 oz (85 g) portion when you eat lean meat, poultry, or fish.  Trans fats are also called "partially hydrogenated oils." They are oils that have been scientifically manipulated so that they are solid at room temperature resulting in a longer shelf life and improved taste and texture of foods in which they are added. Trans fats are found in stick margarine, some tub margarines, cookies, crackers, and baked goods.   When baking and cooking, oils are a great substitute for butter. The monounsaturated oils are  especially beneficial since it is believed they lower LDL and raise HDL. The oils you should avoid entirely are saturated tropical oils, such as coconut and palm.   Remember to eat a lot from food groups that are naturally free of saturated and trans fat, including fish, fruit, vegetables, beans, grains (barley, rice, couscous, bulgur wheat), and pasta (without cream sauces).   IDENTIFYING FOODS THAT LOWER FAT AND CHOLESTEROL   Soluble fiber may lower your cholesterol. This type of fiber is found in fruits such as apples, vegetables such as broccoli, potatoes, and carrots, legumes such as beans, peas, and lentils, and grains such as barley. Foods fortified with plant sterols (phytosterol) may also lower cholesterol. You should eat at least 2 g per day of these foods for a cholesterol lowering effect.   Read package labels to identify low-saturated fats, trans fat free, and low-fat foods at the supermarket. Select cheeses that have only 2 to 3 g saturated fat per ounce. Use a heart-healthy tub margarine that is free of trans fats or partially hydrogenated oil. When buying baked goods (cookies, crackers), avoid partially hydrogenated oils. Breads and muffins should be made from whole grains (whole-wheat or whole oat flour, instead of "flour" or "enriched flour"). Buy non-creamy canned soups with reduced salt and no added fats.   FOOD PREPARATION TECHNIQUES   Never deep-fry. If you must fry, either stir-fry, which uses very little fat, or use non-stick cooking sprays. When possible, broil, bake, or roast meats, and steam vegetables. Instead of putting butter or margarine on vegetables, use lemon   and herbs, applesauce, and cinnamon (for squash and sweet potatoes). Use nonfat yogurt, salsa, and low-fat dressings for salads.   LOW-SATURATED FAT / LOW-FAT FOOD SUBSTITUTES  Meats / Saturated Fat (g)  · Avoid: Steak, marbled (3 oz/85 g) / 11 g  · Choose: Steak, lean (3 oz/85 g) / 4 g  · Avoid: Hamburger (3 oz/85 g) / 7  g  · Choose: Hamburger, lean (3 oz/85 g) / 5 g  · Avoid: Ham (3 oz/85 g) / 6 g  · Choose: Ham, lean cut (3 oz/85 g) / 2.4 g  · Avoid: Chicken, with skin, dark meat (3 oz/85 g) / 4 g  · Choose: Chicken, skin removed, dark meat (3 oz/85 g) / 2 g  · Avoid: Chicken, with skin, light meat (3 oz/85 g) / 2.5 g  · Choose: Chicken, skin removed, light meat (3 oz/85 g) / 1 g  Dairy / Saturated Fat (g)  · Avoid: Whole milk (1 cup) / 5 g  · Choose: Low-fat milk, 2% (1 cup) / 3 g  · Choose: Low-fat milk, 1% (1 cup) / 1.5 g  · Choose: Skim milk (1 cup) / 0.3 g  · Avoid: Hard cheese (1 oz/28 g) / 6 g  · Choose: Skim milk cheese (1 oz/28 g) / 2 to 3 g  · Avoid: Cottage cheese, 4% fat (1 cup) / 6.5 g  · Choose: Low-fat cottage cheese, 1% fat (1 cup) / 1.5 g  · Avoid: Ice cream (1 cup) / 9 g  · Choose: Sherbet (1 cup) / 2.5 g  · Choose: Nonfat frozen yogurt (1 cup) / 0.3 g  · Choose: Frozen fruit bar / trace  · Avoid: Whipped cream (1 tbs) / 3.5 g  · Choose: Nondairy whipped topping (1 tbs) / 1 g  Condiments / Saturated Fat (g)  · Avoid: Mayonnaise (1 tbs) / 2 g  · Choose: Low-fat mayonnaise (1 tbs) / 1 g  · Avoid: Butter (1 tbs) / 7 g  · Choose: Extra light margarine (1 tbs) / 1 g  · Avoid: Coconut oil (1 tbs) / 11.8 g  · Choose: Olive oil (1 tbs) / 1.8 g  · Choose: Corn oil (1 tbs) / 1.7 g  · Choose: Safflower oil (1 tbs) / 1.2 g  · Choose: Sunflower oil (1 tbs) / 1.4 g  · Choose: Soybean oil (1 tbs) / 2.4 g  · Choose: Canola oil (1 tbs) / 1 g  Document Released: 04/12/2005 Document Revised: 08/07/2012 Document Reviewed: 10/01/2010  Exit[employer]® Patient Information ©2014 [employer].

## 2013-07-05 NOTE — Progress Notes (Signed)
Pre visit review using our clinic review tool, if applicable. No additional management support is needed unless otherwise documented below in the visit note. 

## 2013-08-05 ENCOUNTER — Encounter (HOSPITAL_COMMUNITY): Payer: Self-pay | Admitting: Emergency Medicine

## 2013-08-05 ENCOUNTER — Emergency Department (HOSPITAL_COMMUNITY)
Admission: EM | Admit: 2013-08-05 | Discharge: 2013-08-05 | Disposition: A | Discharge status: Home / Self Care | Payer: 59 | Source: Home / Self Care | Attending: Family Medicine | Admitting: Family Medicine

## 2013-08-05 DIAGNOSIS — M549 Dorsalgia, unspecified: Secondary | ICD-10-CM

## 2013-08-05 DIAGNOSIS — M5380 Other specified dorsopathies, site unspecified: Secondary | ICD-10-CM

## 2013-08-05 DIAGNOSIS — M62830 Muscle spasm of back: Secondary | ICD-10-CM

## 2013-08-05 MED ORDER — METHYLPREDNISOLONE ACETATE 80 MG/ML IJ SUSP
INTRAMUSCULAR | Status: AC
  Filled 2013-08-05: qty 1

## 2013-08-05 MED ORDER — NAPROXEN 500 MG PO TABS: 500.0000 mg | ORAL_TABLET | Freq: Two times a day (BID) | ORAL | Status: DC

## 2013-08-05 MED ORDER — TRAMADOL HCL 50 MG PO TABS: 50.0000 mg | ORAL_TABLET | Freq: Four times a day (QID) | ORAL | Status: DC | PRN

## 2013-08-05 MED ORDER — KETOROLAC TROMETHAMINE 60 MG/2ML IM SOLN
60.0000 mg | Freq: Once | INTRAMUSCULAR | Status: AC
  Administered 2013-08-05: 60 mg via INTRAMUSCULAR

## 2013-08-05 MED ORDER — KETOROLAC TROMETHAMINE 60 MG/2ML IM SOLN
INTRAMUSCULAR | Status: AC
  Filled 2013-08-05: qty 2

## 2013-08-05 MED ORDER — METHYLPREDNISOLONE ACETATE 40 MG/ML IJ SUSP
80.0000 mg | Freq: Once | INTRAMUSCULAR | Status: AC
  Administered 2013-08-05: 80 mg via INTRAMUSCULAR

## 2013-08-05 NOTE — ED Provider Notes (Signed)
CSN: [id number]     Arrival date & time 08/05/13  0919 History   First MD Initiated Contact with Patient 08/05/13 [phone]     Chief Complaint  Patient presents with  . Back Pain   (Consider location/radiation/quality/duration/timing/severity/associated sxs/prior Treatment) HPI Comments: 48 year old female presents complaining of left-sided back pain. This began on Tuesday. She feels tightness in her back and pain that radiates down her left leg to her calf. Pain was severe but is not as bad now as it was. She says that this has never happened with her before. She had one episode of numbness in her calf 2 days ago but that has resolved. She denies any further numbness in the leg or groin/genital area, and no loss of bowel or bladder control. No systemic symptoms. She says that she lifts a lot of heavy things at work but has not had any specific injury.  Patient is a 48 y.o. female presenting with back pain.  Back Pain Associated symptoms: numbness (resolved)     Past Medical History  Diagnosis Date  . ALLERGIC RHINITIS 05/18/2007  . Headache(784.0) 03/03/2007  . PARESTHESIA 03/03/2007  . Sinusitis 09/24/2010   History reviewed. No pertinent past surgical history. Family History  Problem Relation Age of Onset  . Hyperlipidemia Neg Hx     family hx  . Hypertension Neg Hx     family hx  . Heart disease Father   . Heart disease Paternal Uncle    History  Substance Use Topics  . Smoking status: Never Smoker   . Smokeless tobacco: Never Used  . Alcohol Use: Yes   OB History   Grav Para Term Preterm Abortions TAB SAB Ect Mult Living                 Review of Systems  Musculoskeletal: Positive for back pain.       And left leg pain  Neurological: Positive for numbness (resolved).  All other systems reviewed and are negative.   Allergies  Review of patient's allergies indicates no known allergies.  Home Medications   Current Outpatient Rx  Name  Route  Sig  Dispense  Refill  .  hyoscyamine (LEVSIN SL) 0.125 MG SL tablet   Sublingual   Place 1 tablet (0.125 mg total) under the tongue every 4 (four) hours as needed.   30 tablet   2   . naproxen (NAPROSYN) 500 MG tablet   Oral   Take 1 tablet (500 mg total) by mouth 2 (two) times daily.   60 tablet   0   . Norgestimate-Ethinyl Estradiol Triphasic 0.18/0.215/0.25 MG-35 MCG tablet   Oral   Take 1 tablet by mouth daily.          Marland Kitchen omeprazole (PRILOSEC) 40 MG capsule   Oral   Take 1 capsule (40 mg total) by mouth daily.   90 capsule   3   . oxyCODONE-acetaminophen (PERCOCET/ROXICET) 5-325 MG per tablet   Oral   Take 1-2 tablets by mouth every 6 (six) hours as needed for pain.   8 tablet   0   . traMADol (ULTRAM) 50 MG tablet   Oral   Take 1 tablet (50 mg total) by mouth every 6 (six) hours as needed.   15 tablet   0    BP 109/70  Pulse 74  Temp(Src) 98.4 F (36.9 C) (Oral)  Resp 16  SpO2 100%  LMP 07/18/2013 Physical Exam  Nursing note and vitals reviewed. Constitutional: She is  oriented to person, place, and time. Vital signs are normal. She appears well-developed and well-nourished. No distress.  HENT:  Head: Normocephalic and atraumatic.  Pulmonary/Chest: Effort normal. No respiratory distress.  Musculoskeletal:       Lumbar back: She exhibits tenderness (left paraspinous musculature), pain and spasm. She exhibits normal range of motion and no bony tenderness.  Neurological: She is alert and oriented to person, place, and time. She has normal strength and normal reflexes. No sensory deficit. She exhibits normal muscle tone. Coordination and gait normal. GCS eye subscore is 4. GCS verbal subscore is 5. GCS motor subscore is 6.  Skin: Skin is warm and dry. No rash noted. She is not diaphoretic.  Psychiatric: She has a normal mood and affect. Judgment normal.    ED Course  Procedures (including critical care time) Labs Review Labs Reviewed - No data to display Imaging Review No results  found.   MDM   1. Back pain   2. Back muscle spasm    Giving Toradol and Depo-Medrol here, discharge her with Naprosyn and tramadol.  Stay active and do exercises. Heating pad when necessary. Followup if not improving   Meds ordered this encounter  Medications  . ketorolac (TORADOL) injection 60 mg    Sig:   . methylPREDNISolone acetate (DEPO-MEDROL) injection 80 mg    Sig:   . naproxen (NAPROSYN) 500 MG tablet    Sig: Take 1 tablet (500 mg total) by mouth 2 (two) times daily.    Dispense:  60 tablet    Refill:  0    Order Specific Question:  Supervising Provider    Answer:  Billy Fischer [phone]  . traMADol (ULTRAM) 50 MG tablet    Sig: Take 1 tablet (50 mg total) by mouth every 6 (six) hours as needed.    Dispense:  15 tablet    Refill:  0    Order Specific Question:  Supervising Provider    Answer:  Ihor Gully D [[id number]]     Liam Graham, PA-C 08/05/13 1020

## 2013-08-05 NOTE — ED Notes (Signed)
C/o back pain which started Tuesday States she works for [employer] and does heavy lifting  Admits pain radiates down left leg when she walks Has used flexeril and parafon as tx

## 2013-08-05 NOTE — ED Provider Notes (Signed)
Medical screening examination/treatment/procedure(s) were performed by resident physician or non-physician practitioner and as supervising physician I was immediately available for consultation/collaboration.   Adonnis Salceda DOUGLAS MD.   Robyn Prentiss D Saquoia Sianez, MD 08/05/13 1021 

## 2013-08-05 NOTE — Discharge Instructions (Signed)
Back Exercises °Back exercises help treat and prevent back injuries. The goal of back exercises is to increase the strength of your abdominal and back muscles and the flexibility of your back. These exercises should be started when you no longer have back pain. Back exercises include: °· Pelvic Tilt. Lie on your back with your knees bent. Tilt your pelvis until the lower part of your back is against the floor. Hold this position 5 to 10 sec and repeat 5 to 10 times. °· Knee to Chest. Pull first 1 knee up against your chest and hold for 20 to 30 seconds, repeat this with the other knee, and then both knees. This may be done with the other leg straight or bent, whichever feels better. °· Sit-Ups or Curl-Ups. Bend your knees 90 degrees. Start with tilting your pelvis, and do a partial, slow sit-up, lifting your trunk only 30 to 45 degrees off the floor. Take at least 2 to 3 seconds for each sit-up. Do not do sit-ups with your knees out straight. If partial sit-ups are difficult, simply do the above but with only tightening your abdominal muscles and holding it as directed. °· Hip-Lift. Lie on your back with your knees flexed 90 degrees. Push down with your feet and shoulders as you raise your hips a couple inches off the floor; hold for 10 seconds, repeat 5 to 10 times. °· Back arches. Lie on your stomach, propping yourself up on bent elbows. Slowly press on your hands, causing an arch in your low back. Repeat 3 to 5 times. Any initial stiffness and discomfort should lessen with repetition over time. °· Shoulder-Lifts. Lie face down with arms beside your body. Keep hips and torso pressed to floor as you slowly lift your head and shoulders off the floor. °Do not overdo your exercises, especially in the beginning. Exercises may cause you some mild back discomfort which lasts for a few minutes; however, if the pain is more severe, or lasts for more than 15 minutes, do not continue exercises until you see your caregiver.  Improvement with exercise therapy for back problems is slow.  °See your caregivers for assistance with developing a proper back exercise program. °Document Released: 05/20/2004 Document Revised: 07/05/2011 Document Reviewed: 02/11/2011 °Exit[employer]® Patient Information ©2014 [employer]. ° °Back Pain, Adult °Low back pain is very common. About 1 in 5 people have back pain. The cause of low back pain is rarely dangerous. The pain often gets better over time. About half of people with a sudden onset of back pain feel better in just 2 weeks. About 8 in 10 people feel better by 6 weeks.  °CAUSES °Some common causes of back pain include: °· Strain of the muscles or ligaments supporting the spine. °· Wear and tear (degeneration) of the spinal discs. °· Arthritis. °· Direct injury to the back. °DIAGNOSIS °Most of the time, the direct cause of low back pain is not known. However, back pain can be treated effectively even when the exact cause of the pain is unknown. Answering your caregiver's questions about your overall health and symptoms is one of the most accurate ways to make sure the cause of your pain is not dangerous. If your caregiver needs more information, he or she may order lab work or imaging tests (X-rays or MRIs). However, even if imaging tests show changes in your back, this usually does not require surgery. °HOME CARE INSTRUCTIONS °For many people, back pain returns. Since low back pain is rarely dangerous, it is often a condition that people   can learn to manage on their own.  °· Remain active. It is stressful on the back to sit or stand in one place. Do not sit, drive, or stand in one place for more than 30 minutes at a time. Take short walks on level surfaces as soon as pain allows. Try to increase the length of time you walk each day. °· Do not stay in bed. Resting more than 1 or 2 days can delay your recovery. °· Do not avoid exercise or work. Your body is made to move. It is not dangerous to be active,  even though your back may hurt. Your back will likely heal faster if you return to being active before your pain is gone. °· Pay attention to your body when you  bend and lift. Many people have less discomfort when lifting if they bend their knees, keep the load close to their bodies, and avoid twisting. Often, the most comfortable positions are those that put less stress on your recovering back. °· Find a comfortable position to sleep. Use a firm mattress and lie on your side with your knees slightly bent. If you lie on your back, put a pillow under your knees. °· Only take over-the-counter or prescription medicines as directed by your caregiver. Over-the-counter medicines to reduce pain and inflammation are often the most helpful. Your caregiver may prescribe muscle relaxant drugs. These medicines help dull your pain so you can more quickly return to your normal activities and healthy exercise. °· Put ice on the injured area. °· Put ice in a plastic bag. °· Place a towel between your skin and the bag. °· Leave the ice on for 15-20 minutes, 03-04 times a day for the first 2 to 3 days. After that, ice and heat may be alternated to reduce pain and spasms. °· Ask your caregiver about trying back exercises and gentle massage. This may be of some benefit. °· Avoid feeling anxious or stressed. Stress increases muscle tension and can worsen back pain. It is important to recognize when you are anxious or stressed and learn ways to manage it. Exercise is a great option. °SEEK MEDICAL CARE IF: °· You have pain that is not relieved with rest or medicine. °· You have pain that does not improve in 1 week. °· You have new symptoms. °· You are generally not feeling well. °SEEK IMMEDIATE MEDICAL CARE IF:  °· You have pain that radiates from your back into your legs. °· You develop new bowel or bladder control problems. °· You have unusual weakness or numbness in your arms or legs. °· You develop nausea or vomiting. °· You develop  abdominal pain. °· You feel faint. °Document Released: 04/12/2005 Document Revised: 10/12/2011 Document Reviewed: 08/31/2010 °Exit[employer]® Patient Information ©2014 [employer]. ° °

## 2013-08-16 ENCOUNTER — Telehealth: Payer: Self-pay | Admitting: Internal Medicine

## 2013-08-16 MED ORDER — OMEPRAZOLE 40 MG PO CPDR: 40.0000 mg | DELAYED_RELEASE_CAPSULE | Freq: Every day | ORAL | Status: DC

## 2013-08-16 NOTE — Telephone Encounter (Signed)
Rx sent to [facility] 

## 2013-08-16 NOTE — Telephone Encounter (Signed)
Pt is needing new rx omeprazole (PRILOSEC) 40 MG capsule, 90 day supply sent to [facility] [address] [place].[phone]

## 2013-10-09 ENCOUNTER — Other Ambulatory Visit: Payer: Self-pay

## 2013-10-09 DIAGNOSIS — Z1231 Encounter for screening mammogram for malignant neoplasm of breast: Secondary | ICD-10-CM

## 2013-10-23 ENCOUNTER — Ambulatory Visit
Admission: RE | Admit: 2013-10-23 | Discharge: 2013-10-23 | Disposition: A | Discharge status: Home / Self Care | Payer: 59 | Source: Ambulatory Visit

## 2013-10-23 DIAGNOSIS — Z1231 Encounter for screening mammogram for malignant neoplasm of breast: Secondary | ICD-10-CM

## 2014-01-10 ENCOUNTER — Encounter: Payer: Self-pay | Admitting: Physician Assistant

## 2014-01-10 ENCOUNTER — Telehealth: Payer: Self-pay | Admitting: Internal Medicine

## 2014-01-10 ENCOUNTER — Ambulatory Visit (INDEPENDENT_AMBULATORY_CARE_PROVIDER_SITE_OTHER): Payer: 59 | Admitting: Physician Assistant

## 2014-01-10 VITALS — BP 102/72 | HR 60 | Temp 98.4°F | Resp 18 | Wt 134.4 lb

## 2014-01-10 DIAGNOSIS — J0100 Acute maxillary sinusitis, unspecified: Secondary | ICD-10-CM

## 2014-01-10 DIAGNOSIS — M545 Low back pain, unspecified: Secondary | ICD-10-CM

## 2014-01-10 MED ORDER — AMOXICILLIN-POT CLAVULANATE 875-125 MG PO TABS: 1.0000 | ORAL_TABLET | Freq: Two times a day (BID) | ORAL | Status: DC

## 2014-01-10 MED ORDER — KETOROLAC TROMETHAMINE 60 MG/2ML IM SOLN
60.0000 mg | Freq: Once | INTRAMUSCULAR | Status: AC
  Administered 2014-01-10: 60 mg via INTRAMUSCULAR

## 2014-01-10 MED ORDER — CYCLOBENZAPRINE HCL 5 MG PO TABS: 5.0000 mg | ORAL_TABLET | Freq: Three times a day (TID) | ORAL | Status: DC | PRN

## 2014-01-10 MED ORDER — MELOXICAM 15 MG PO TABS: 15.0000 mg | ORAL_TABLET | Freq: Every day | ORAL | Status: DC

## 2014-01-10 NOTE — Telephone Encounter (Signed)
Needs OV.  

## 2014-01-10 NOTE — Telephone Encounter (Signed)
Pt said she is having some low back pain and has it before and was given some prednisone . Pt is requesting a rx for this.    Pharmacy; [facility]

## 2014-01-10 NOTE — Progress Notes (Signed)
Pre visit review using our clinic review tool, if applicable. No additional management support is needed unless otherwise documented below in the visit note. 

## 2014-01-10 NOTE — Progress Notes (Signed)
Subjective:    Patient ID: Robyn Medina, female    DOB: 03/10/1966, 48 y.o.   MRN: [id number]  Back Pain Chronicity: Patient had a similar episode to this in May of this year,. The current episode started today (woke up with the pain.). The problem occurs constantly. The problem is unchanged. The pain is present in the lumbar spine. The quality of the pain is described as shooting and aching. The pain does not radiate. The pain is at a severity of 8/10. The symptoms are aggravated by bending, twisting and position. Associated symptoms include headaches. Pertinent negatives include no abdominal pain, bladder incontinence, bowel incontinence, chest pain, dysuria, fever, leg pain, numbness, paresis, paresthesias, pelvic pain, perianal numbness, tingling, weakness or weight loss. Treatments tried: aleve. The treatment provided no relief.  Sinusitis This is a new problem. The current episode started 1 to 4 weeks ago (2 weeks). The problem has been gradually worsening since onset. There has been no fever. Her pain is at a severity of 8/10. Associated symptoms include congestion, coughing (dry), headaches and sinus pressure. Pertinent negatives include no chills, diaphoresis, ear pain, hoarse voice, neck pain, shortness of breath, sneezing, sore throat or swollen glands. Treatments tried: claritin, allegra D, aleve sinus. The treatment provided mild relief.      Review of Systems  Constitutional: Negative for fever, chills, weight loss and diaphoresis.  HENT: Positive for congestion, postnasal drip and sinus pressure. Negative for ear pain, hoarse voice, sneezing and sore throat.   Respiratory: Positive for cough (dry). Negative for shortness of breath.   Cardiovascular: Negative for chest pain.  Gastrointestinal: Negative for nausea, vomiting, abdominal pain, diarrhea and bowel incontinence.  Genitourinary: Negative for bladder incontinence, dysuria and pelvic pain.  Musculoskeletal: Positive for back  pain. Negative for gait problem and neck pain.  Neurological: Positive for headaches. Negative for tingling, syncope, weakness, numbness and paresthesias.  All other systems reviewed and are negative.    Past Medical History  Diagnosis Date  . ALLERGIC RHINITIS 05/18/2007  . Headache(784.0) 03/03/2007  . PARESTHESIA 03/03/2007  . Sinusitis 09/24/2010    History   Social History  . Marital Status: Married    Spouse Name: N/A    Number of Children: N/A  . Years of Education: N/A   Occupational History  . Not on file.   Social History Main Topics  . Smoking status: Never Smoker   . Smokeless tobacco: Never Used  . Alcohol Use: Yes  . Drug Use: No  . Sexual Activity: Not on file   Other Topics Concern  . Not on file   Social History Narrative  . No narrative on file    History reviewed. No pertinent past surgical history.  Family History  Problem Relation Age of Onset  . Hyperlipidemia Neg Hx     family hx  . Hypertension Neg Hx     family hx  . Heart disease Father   . Heart disease Paternal Uncle     No Known Allergies  Current Outpatient Prescriptions on File Prior to Visit  Medication Sig Dispense Refill  . hyoscyamine (LEVSIN SL) 0.125 MG SL tablet Place 1 tablet (0.125 mg total) under the tongue every 4 (four) hours as needed.  30 tablet  2  . naproxen (NAPROSYN) 500 MG tablet Take 1 tablet (500 mg total) by mouth 2 (two) times daily.  60 tablet  0  . Norgestimate-Ethinyl Estradiol Triphasic 0.18/0.215/0.25 MG-35 MCG tablet Take 1 tablet by mouth daily.       Marland Kitchen  omeprazole (PRILOSEC) 40 MG capsule Take 1 capsule (40 mg total) by mouth daily.  90 capsule  3  . oxyCODONE-acetaminophen (PERCOCET/ROXICET) 5-325 MG per tablet Take 1-2 tablets by mouth every 6 (six) hours as needed for pain.  8 tablet  0  . traMADol (ULTRAM) 50 MG tablet Take 1 tablet (50 mg total) by mouth every 6 (six) hours as needed.  15 tablet  0   No current facility-administered medications  on file prior to visit.   The PFS history was reviewed at the time of visit.  EXAM: BP 102/72  Pulse 60  Temp(Src) 98.4 F (36.9 C) (Oral)  Resp 18  Wt 134 lb 6.4 oz (60.963 kg)     Objective:   Physical Exam  Nursing note and vitals reviewed. Constitutional: She is oriented to person, place, and time. She appears well-developed and well-nourished. No distress.  HENT:  Head: Normocephalic and atraumatic.  Right Ear: External ear normal.  Left Ear: External ear normal.  Nose: Nose normal.  Mouth/Throat: No oropharyngeal exudate.  Oropharynx is slightly erythematous, no exudate. Bilateral TMs normal. Bilateral frontal sinuses non-ttp Bilateral maxillary sinuses TTP.  Eyes: Conjunctivae and EOM are normal. Pupils are equal, round, and reactive to light.  Neck: Normal range of motion. Neck supple.  Cardiovascular: Normal rate, regular rhythm and intact distal pulses.   Pulmonary/Chest: Effort normal and breath sounds normal. No stridor. No respiratory distress. She has no wheezes. She has no rales. She exhibits no tenderness.  Musculoskeletal: Normal range of motion. She exhibits tenderness. She exhibits no edema.  Mild tenderness to palpation of the paraspinal musculature, right greater than left. No bony tenderness. Gait normal SLR normal  Lymphadenopathy:    She has no cervical adenopathy.  Neurological: She is alert and oriented to person, place, and time.  Sensation, strength, and reflexes all grossly intact.  Skin: Skin is warm and dry. She is not diaphoretic. No pallor.  Psychiatric: She has a normal mood and affect. Her behavior is normal. Judgment and thought content normal.     Lab Results  Component Value Date   WBC 7.6 06/29/2013   HGB 13.3 06/29/2013   HCT 39.2 06/29/2013   PLT 270.0 06/29/2013   GLUCOSE 78 06/29/2013   CHOL 233* 06/29/2013   TRIG 156.0* 06/29/2013   HDL 56.20 06/29/2013   LDLDIRECT 149.8 06/21/2012   LDLCALC 146* 06/29/2013   ALT 13 06/29/2013   AST 12  06/29/2013   NA 137 06/29/2013   K 4.0 06/29/2013   CL 105 06/29/2013   CREATININE 0.9 06/29/2013   BUN 13 06/29/2013   CO2 29 06/29/2013   TSH 1.27 06/29/2013        Assessment & Plan:  Jackelyn Poling was seen today for back pain and sinusitis.  Diagnoses and associated orders for this visit:  Acute low back pain Comments: Toradol in office. Trial of muscle relaxant and Mobic daily. Watchful waiting.  - meloxicam (MOBIC) 15 MG tablet; Take 1 tablet (15 mg total) by mouth daily. - cyclobenzaprine (FLEXERIL) 5 MG tablet; Take 1 tablet (5 mg total) by mouth 3 (three) times daily as needed for muscle spasms. - ketorolac (TORADOL) injection 60 mg; Inject 2 mLs (60 mg total) into the muscle once.  Acute maxillary sinusitis, recurrence not specified Comments: Treat with augmentin. add otc mucinex, nasal steroid, antihistamine, rest, push fluids. - amoxicillin-clavulanate (AUGMENTIN) 875-125 MG per tablet; Take 1 tablet by mouth 2 (two) times daily.    Discussed with patient that  if her back pain symptoms did not improve or persisted, we may consider having her see an orthopedist due to her similar history of back pain in the recent past. Patient is amenable to this plan.  Return precautions provided, and patient handout on back pain and sinusitis.  Plan to follow up as needed, or for worsening or persistent symptoms despite treatment.  Patient Instructions  MOBIC once daily starting tomorrow for anti-inflammation and to help pain. You cannot take any other NSAID medication with this (eg Advil, Motrin, ibuprofen, Aleve, naproxen).  Flexeril every 8 hours as needed for muscle spasm. Do not drive while taking this medication as it will inhibit your ability to operate a motor vehicle.  Augmentin twice daily for 10 days for sinus infection.  Plain Over the Counter Mucinex (NOT Mucinex D) for thick secretions  Force NON dairy fluids, drinking plenty of water is best.    Over the Counter Flonase OR Nasacort  AQ 1 spray in each nostril twice a day as needed. Use the "crossover" technique into opposite nostril spraying toward opposite ear @ 45 degree angle, not straight up into nostril.   Plain Over the Counter Allegra (NOT D )  160 daily , OR Loratidine 10 mg , OR Zyrtec 10 mg @ bedtime  as needed for itchy eyes & sneezing.  Saline Irrigation and Saline Sprays can also help reduce symptoms.  If your back pain persists despite treatment, you may want to schedule an appointment with an orthopedist. You should not need a referral for this.   If emergency symptoms discussed during visit developed, seek medical attention immediately.  Followup as needed, or for worsening or persistent symptoms despite treatment.

## 2014-01-10 NOTE — Telephone Encounter (Signed)
Ov needed 

## 2014-01-10 NOTE — Patient Instructions (Addendum)
MOBIC once daily starting tomorrow for anti-inflammation and to help pain. You cannot take any other NSAID medication with this (eg Advil, Motrin, ibuprofen, Aleve, naproxen).  Flexeril every 8 hours as needed for muscle spasm. Do not drive while taking this medication as it will inhibit your ability to operate a motor vehicle.  Augmentin twice daily for 10 days for sinus infection.  Plain Over the Counter Mucinex (NOT Mucinex D) for thick secretions  Force NON dairy fluids, drinking plenty of water is best.    Over the Counter Flonase OR Nasacort AQ 1 spray in each nostril twice a day as needed. Use the "crossover" technique into opposite nostril spraying toward opposite ear @ 45 degree angle, not straight up into nostril.   Plain Over the Counter Allegra (NOT D )  160 daily , OR Loratidine 10 mg , OR Zyrtec 10 mg @ bedtime  as needed for itchy eyes & sneezing.  Saline Irrigation and Saline Sprays can also help reduce symptoms.  If your back pain persists despite treatment, you may want to schedule an appointment with an orthopedist. You should not need a referral for this.   If emergency symptoms discussed during visit developed, seek medical attention immediately.  Followup as needed, or for worsening or persistent symptoms despite treatment.     Back Pain, Adult Back pain is very common. The pain often gets better over time. The cause of back pain is usually not dangerous. Most people can learn to manage their back pain on their own.  HOME CARE   Stay active. Start with short walks on flat ground if you can. Try to walk farther each day.  Do not sit, drive, or stand in one place for more than 30 minutes. Do not stay in bed.  Do not avoid exercise or work. Activity can help your back heal faster.  Be careful when you bend or lift an object. Bend at your knees, keep the object close to you, and do not twist.  Sleep on a firm mattress. Lie on your side, and bend your knees. If  you lie on your back, put a pillow under your knees.  Only take medicines as told by your doctor.  Put ice on the injured area.  Put ice in a plastic bag.  Place a towel between your skin and the bag.  Leave the ice on for 15-20 minutes, 03-04 times a day for the first 2 to 3 days. After that, you can switch between ice and heat packs.  Ask your doctor about back exercises or massage.  Avoid feeling anxious or stressed. Find good ways to deal with stress, such as exercise. GET HELP RIGHT AWAY IF:   Your pain does not go away with rest or medicine.  Your pain does not go away in 1 week.  You have new problems.  You do not feel well.  The pain spreads into your legs.  You cannot control when you poop (bowel movement) or pee (urinate).  Your arms or legs feel weak or lose feeling (numbness).  You feel sick to your stomach (nauseous) or throw up (vomit).  You have belly (abdominal) pain.  You feel like you may pass out (faint). MAKE SURE YOU:   Understand these instructions.  Will watch your condition.  Will get help right away if you are not doing well or get worse. Document Released: 09/29/2007 Document Revised: 07/05/2011 Document Reviewed: 08/14/2013 [facility] Patient Information 2015 [place], [place]. This information is not intended  to replace advice given to you by your health care provider. Make sure you discuss any questions you have with your health care provider. Sinusitis Sinusitis is redness, soreness, and puffiness (inflammation) of the air pockets in the bones of your face (sinuses). The redness, soreness, and puffiness can cause air and mucus to get trapped in your sinuses. This can allow germs to grow and cause an infection.  HOME CARE   Drink enough fluids to keep your pee (urine) clear or pale yellow.  Use a humidifier in your home.  Run a hot shower to create steam in the bathroom. Sit in the bathroom with the door closed. Breathe in the steam 3-4  times a day.  Put a warm, moist washcloth on your face 3-4 times a day, or as told by your doctor.  Use salt water sprays (saline sprays) to wet the thick fluid in your nose. This can help the sinuses drain.  Only take medicine as told by your doctor. GET HELP RIGHT AWAY IF:   Your pain gets worse.  You have very bad headaches.  You are sick to your stomach (nauseous).  You throw up (vomit).  You are very sleepy (drowsy) all the time.  Your face is puffy (swollen).  Your vision changes.  You have a stiff neck.  You have trouble breathing. MAKE SURE YOU:   Understand these instructions.  Will watch your condition.  Will get help right away if you are not doing well or get worse. Document Released: 09/29/2007 Document Revised: 01/05/2012 Document Reviewed: 11/16/2011 [facility] Patient Information 2015 [place], [place]. This information is not intended to replace advice given to you by your health care provider. Make sure you discuss any questions you have with your health care provider.

## 2014-01-10 NOTE — Telephone Encounter (Signed)
Ov scheduled with Robyn Medina today at 2:15.

## 2014-02-12 ENCOUNTER — Ambulatory Visit: Payer: 59 | Admitting: Family

## 2014-07-02 ENCOUNTER — Ambulatory Visit (INDEPENDENT_AMBULATORY_CARE_PROVIDER_SITE_OTHER): Payer: BLUE CROSS/BLUE SHIELD | Admitting: Internal Medicine

## 2014-07-02 ENCOUNTER — Encounter: Payer: Self-pay | Admitting: Internal Medicine

## 2014-07-02 VITALS — BP 110/70 | HR 89 | Temp 98.4°F | Resp 18 | Ht 64.0 in | Wt 141.0 lb

## 2014-07-02 DIAGNOSIS — J309 Allergic rhinitis, unspecified: Secondary | ICD-10-CM

## 2014-07-02 DIAGNOSIS — M545 Low back pain, unspecified: Secondary | ICD-10-CM

## 2014-07-02 MED ORDER — TRAMADOL HCL 50 MG PO TABS: 50.0000 mg | ORAL_TABLET | Freq: Four times a day (QID) | ORAL | Status: DC | PRN

## 2014-07-02 MED ORDER — CYCLOBENZAPRINE HCL 5 MG PO TABS: 5.0000 mg | ORAL_TABLET | Freq: Three times a day (TID) | ORAL | Status: DC | PRN

## 2014-07-02 NOTE — Progress Notes (Signed)
Subjective:    Patient ID: Robyn Medina, female    DOB: Dec 22, 1965, 49 y.o.   MRN: [id number]  HPI 49 year old patient who presents with a 2 day history of abdominal bloating as well as mid back discomfort.  She does have a history of reflux and has resumed omeprazole.  2 days ago.  At the present time her mid back pain which was quite severe has resolved.  Her only complaint now is some very mild right lower quadrant tenderness.  There is been no nausea, vomiting or change in her bowel habits.  No fever or other constitutional complaints.  Past Medical History  Diagnosis Date  . ALLERGIC RHINITIS 05/18/2007  . Headache(784.0) 03/03/2007  . PARESTHESIA 03/03/2007  . Sinusitis 09/24/2010    History   Social History  . Marital Status: Married    Spouse Name: N/A  . Number of Children: N/A  . Years of Education: N/A   Occupational History  . Not on file.   Social History Main Topics  . Smoking status: Never Smoker   . Smokeless tobacco: Never Used  . Alcohol Use: Yes  . Drug Use: No  . Sexual Activity: Not on file   Other Topics Concern  . Not on file   Social History Narrative    No past surgical history on file.  Family History  Problem Relation Age of Onset  . Hyperlipidemia Neg Hx     family hx  . Hypertension Neg Hx     family hx  . Heart disease Father   . Heart disease Paternal Uncle     No Known Allergies  Current Outpatient Prescriptions on File Prior to Visit  Medication Sig Dispense Refill  . Norgestimate-Ethinyl Estradiol Triphasic 0.18/0.215/0.25 MG-35 MCG tablet Take 1 tablet by mouth daily.     Marland Kitchen omeprazole (PRILOSEC) 40 MG capsule Take 1 capsule (40 mg total) by mouth daily. 90 capsule 3  . naproxen (NAPROSYN) 500 MG tablet Take 1 tablet (500 mg total) by mouth 2 (two) times daily. (Patient not taking: Reported on 07/02/2014) 60 tablet 0   No current facility-administered medications on file prior to visit.    BP 110/70 mmHg  Pulse 89   Temp(Src) 98.4 F (36.9 C) (Oral)  Resp 18  Ht 5\' 4"  (1.626 m)  Wt 141 lb (63.957 kg)  BMI 24.19 kg/m2  SpO2 98%      Review of Systems  Constitutional: Negative.   HENT: Negative for congestion, dental problem, hearing loss, rhinorrhea, sinus pressure, sore throat and tinnitus.   Eyes: Negative for pain, discharge and visual disturbance.  Respiratory: Negative for cough and shortness of breath.   Cardiovascular: Negative for chest pain, palpitations and leg swelling.  Gastrointestinal: Positive for abdominal pain. Negative for nausea, vomiting, diarrhea, constipation, blood in stool and abdominal distention.  Genitourinary: Positive for flank pain. Negative for dysuria, urgency, frequency, hematuria, vaginal bleeding, vaginal discharge, difficulty urinating, vaginal pain and pelvic pain.  Musculoskeletal: Positive for back pain. Negative for joint swelling, arthralgias and gait problem.  Skin: Negative for rash.  Neurological: Negative for dizziness, syncope, speech difficulty, weakness, numbness and headaches.  Hematological: Negative for adenopathy.  Psychiatric/Behavioral: Negative for behavioral problems, dysphoric mood and agitation. The patient is not nervous/anxious.        Objective:   Physical Exam  Constitutional: She is oriented to person, place, and time. She appears well-developed and well-nourished.  HENT:  Head: Normocephalic.  Right Ear: External ear normal.  Left Ear:  External ear normal.  Mouth/Throat: Oropharynx is clear and moist.  Eyes: Conjunctivae and EOM are normal. Pupils are equal, round, and reactive to light.  Neck: Normal range of motion. Neck supple. No thyromegaly present.  Cardiovascular: Normal rate, regular rhythm, normal heart sounds and intact distal pulses.   Pulmonary/Chest: Effort normal and breath sounds normal.  Abdominal: Soft. Bowel sounds are normal. She exhibits no mass. There is tenderness.  Very mild right lower quadrant  tenderness without guarding.  Bowel sounds active.  No CVA tenderness  Musculoskeletal: Normal range of motion.  Lymphadenopathy:    She has no cervical adenopathy.  Neurological: She is alert and oriented to person, place, and time.  Skin: Skin is warm and dry. No rash noted.  Psychiatric: She has a normal mood and affect. Her behavior is normal.          Assessment & Plan:   Nonspecific abdominal and mid back pain.  Much improved compared to yesterday.  The patient has resumed the omeprazole and will continue for at least 2 more weeks.  Medications updated.  Will observe at this time but will take Flexeril and/or tramadol if needed GERD.  We'll continue antireflux regimen and omeprazole

## 2014-07-02 NOTE — Progress Notes (Signed)
Pre visit review using our clinic review tool, if applicable. No additional management support is needed unless otherwise documented below in the visit note. 

## 2014-07-02 NOTE — Patient Instructions (Addendum)
Call or return to clinic prn if these symptoms worsen or fail to improve as anticipated.  Avoids foods high in acid such as tomatoes citrus juices, and spicy foods.  Avoid eating within two hours of lying down or before exercising.  Do not overheat.  Try smaller more frequent meals.   Continue omeprazole once daily

## 2014-07-16 LAB — HM PAP SMEAR: HM Pap smear: NORMAL

## 2014-09-13 ENCOUNTER — Other Ambulatory Visit: Payer: Self-pay | Admitting: *Deleted

## 2014-09-13 MED ORDER — OMEPRAZOLE 40 MG PO CPDR: 40.0000 mg | DELAYED_RELEASE_CAPSULE | Freq: Every day | ORAL | Status: DC

## 2014-11-29 ENCOUNTER — Ambulatory Visit (INDEPENDENT_AMBULATORY_CARE_PROVIDER_SITE_OTHER): Payer: BLUE CROSS/BLUE SHIELD | Admitting: Adult Health

## 2014-11-29 ENCOUNTER — Encounter: Payer: Self-pay | Admitting: Adult Health

## 2014-11-29 VITALS — BP 130/82 | Temp 98.6°F | Ht 64.0 in | Wt 142.2 lb

## 2014-11-29 DIAGNOSIS — H0266 Xanthelasma of left eye, unspecified eyelid: Secondary | ICD-10-CM | POA: Diagnosis not present

## 2014-11-29 NOTE — Progress Notes (Signed)
   Subjective:    Patient ID: Robyn Medina, female    DOB: 11-Oct-1965, 49 y.o.   MRN: [id number]  HPI  49 year old female who presents to the office today for complaint of " spots in the corner of my left eye. A friend told me that she had the same spots and it was shingles."  These "spots" have been there for 4 months. They do not itch, they are not painful, and they do not have any sensation to touch.    Review of Systems  Constitutional: Negative.   Eyes: Negative.   All other systems reviewed and are negative.      Objective:   Physical Exam  Constitutional: She is oriented to person, place, and time. She appears well-developed and well-nourished. No distress.  Eyes: Conjunctivae, EOM and lids are normal. Pupils are equal, round, and reactive to light. Right eye exhibits no discharge and no hordeolum. No foreign body present in the right eye. Left eye exhibits no discharge and no hordeolum. No foreign body present in the left eye.    Neurological: She is alert and oriented to person, place, and time.  Skin: Skin is warm and dry.  Psychiatric: She has a normal mood and affect. Her behavior is normal. Judgment and thought content normal.  Nursing note and vitals reviewed.      Assessment & Plan:  1. Xanthelasma, left - originally thought that the spots were cystic or acne related. Upon further investigation, patient had lipid profile done in 2015. Her total cholesterol was 233, trig 156, and LDL 146. She is not on a statin medication - Patient notified that she needs to come back for fasting labs.

## 2014-11-29 NOTE — Progress Notes (Signed)
Pre visit review using our clinic review tool, if applicable. No additional management support is needed unless otherwise documented below in the visit note. 

## 2014-11-29 NOTE — Addendum Note (Signed)
Addended by: Apolinar Junes on: 11/29/2014 05:03 PM   Modules accepted: Orders

## 2014-12-02 ENCOUNTER — Other Ambulatory Visit: Payer: Self-pay | Admitting: Adult Health

## 2014-12-02 ENCOUNTER — Telehealth: Payer: Self-pay | Admitting: Adult Health

## 2014-12-02 ENCOUNTER — Other Ambulatory Visit (INDEPENDENT_AMBULATORY_CARE_PROVIDER_SITE_OTHER): Payer: BLUE CROSS/BLUE SHIELD

## 2014-12-02 DIAGNOSIS — H0266 Xanthelasma of left eye, unspecified eyelid: Secondary | ICD-10-CM | POA: Diagnosis not present

## 2014-12-02 DIAGNOSIS — E785 Hyperlipidemia, unspecified: Secondary | ICD-10-CM | POA: Diagnosis not present

## 2014-12-02 LAB — HEPATIC FUNCTION PANEL
ALT: 8 U/L (ref 0–35)
AST: 11 U/L (ref 0–37)
Albumin: 4 g/dL (ref 3.5–5.2)
Alkaline Phosphatase: 49 U/L (ref 39–117)
Bilirubin, Direct: 0.1 mg/dL (ref 0.0–0.3)
Total Bilirubin: 0.4 mg/dL (ref 0.2–1.2)
Total Protein: 7.1 g/dL (ref 6.0–8.3)

## 2014-12-02 LAB — LIPID PANEL
Cholesterol: 229 mg/dL — ABNORMAL HIGH (ref 0–200)
HDL: 54.2 mg/dL (ref >39.00)
LDL Cholesterol: 143 mg/dL — ABNORMAL HIGH (ref 0–99)
NonHDL: 174.99
Total CHOL/HDL Ratio: 4
Triglycerides: 159 mg/dL — ABNORMAL HIGH (ref 0.0–149.0)
VLDL: 31.8 mg/dL (ref 0.0–40.0)

## 2014-12-02 MED ORDER — ATORVASTATIN CALCIUM 20 MG PO TABS: 20.0000 mg | ORAL_TABLET | Freq: Every day | ORAL | Status: DC

## 2014-12-02 NOTE — Telephone Encounter (Signed)
Spoke to patient on the phone and informed her of her lipid panel. Patient willing to trial low dose Lipitor. Sent in 20mg  Lipitor x 90 pills with 3 refills. Follow up in 4 months for repeat lipid

## 2015-01-23 ENCOUNTER — Other Ambulatory Visit: Payer: Self-pay

## 2015-01-23 DIAGNOSIS — Z1231 Encounter for screening mammogram for malignant neoplasm of breast: Secondary | ICD-10-CM

## 2015-01-31 ENCOUNTER — Ambulatory Visit
Admission: RE | Admit: 2015-01-31 | Discharge: 2015-01-31 | Disposition: A | Discharge status: Home / Self Care | Payer: BLUE CROSS/BLUE SHIELD | Source: Ambulatory Visit

## 2015-01-31 DIAGNOSIS — Z1231 Encounter for screening mammogram for malignant neoplasm of breast: Secondary | ICD-10-CM

## 2015-04-29 ENCOUNTER — Ambulatory Visit (INDEPENDENT_AMBULATORY_CARE_PROVIDER_SITE_OTHER): Payer: Managed Care, Other (non HMO) | Admitting: Family Medicine

## 2015-04-29 ENCOUNTER — Encounter: Payer: Self-pay | Admitting: Family Medicine

## 2015-04-29 ENCOUNTER — Encounter: Payer: Self-pay | Admitting: *Deleted

## 2015-04-29 VITALS — BP 100/62 | HR 100 | Temp 98.7°F | Ht 64.0 in | Wt 140.4 lb

## 2015-04-29 DIAGNOSIS — J111 Influenza due to unidentified influenza virus with other respiratory manifestations: Secondary | ICD-10-CM | POA: Diagnosis not present

## 2015-04-29 DIAGNOSIS — R300 Dysuria: Secondary | ICD-10-CM | POA: Diagnosis not present

## 2015-04-29 DIAGNOSIS — R69 Illness, unspecified: Principal | ICD-10-CM

## 2015-04-29 LAB — POCT URINALYSIS DIPSTICK
Bilirubin, UA: NEGATIVE
Glucose, UA: NEGATIVE
Ketones, UA: NEGATIVE
Nitrite, UA: POSITIVE
Protein, UA: NEGATIVE
Spec Grav, UA: 1.025
Urobilinogen, UA: 0.2
pH, UA: 6

## 2015-04-29 LAB — POCT INFLUENZA A/B
Influenza A, POC: NEGATIVE
Influenza B, POC: NEGATIVE

## 2015-04-29 MED ORDER — NITROFURANTOIN MONOHYD MACRO 100 MG PO CAPS: 100.0000 mg | ORAL_CAPSULE | Freq: Two times a day (BID) | ORAL | Status: DC

## 2015-04-29 NOTE — Progress Notes (Signed)
Pre visit review using our clinic review tool, if applicable. No additional management support is needed unless otherwise documented below in the visit note. 

## 2015-04-29 NOTE — Patient Instructions (Addendum)
-  Take the antibiotic as prescribed for the urinary tract infection  -follow up if worsening or not improving as expected  INSTRUCTIONS FOR UPPER RESPIRATORY ILLNESS:  -plenty of rest and fluids  -nasal saline wash 2-3 times daily (use prepackaged nasal saline or bottled/distilled water if making your own)   -can use AFRIN nasal spray for drainage and nasal congestion - but do NOT use longer then 3-4 days  -can use tylenol (in no history of liver disease) or ibuprofen (if no history of kidney disease, bowel bleeding or significant heart disease) as directed for aches and sorethroat  -in the winter time, using a humidifier at night is helpful (please follow cleaning instructions)  -if you are taking a cough medication - use only as directed, may also try a teaspoon of honey to coat the throat and throat lozenges. If given a cough medication with codeine or hydrocodone or other narcotic please be advised that this contains a strong and  potentially addicting medication. Please follow instructions carefully, take as little as possible and only use AS NEEDED for severe cough. Discuss potential side effects with your pharmacy. Please do not drive or operate machinery while taking these types of medications. Please do not take other sedating medications, drugs or alcohol while taking this medication without discussing with your doctor.  -for sore throat, salt water gargles can help  -follow up if you have fevers, facial pain, tooth pain, difficulty breathing or are worsening or symptoms persist longer then expected  Upper Respiratory Infection, Adult An upper respiratory infection (URI) is also known as the common cold. It is often caused by a type of germ (virus). Colds are easily spread (contagious). You can pass it to others by kissing, coughing, sneezing, or drinking out of the same glass. Usually, you get better in 1 to 3  weeks.  However, the cough can last for even longer. HOME CARE   Only  take medicine as told by your doctor. Follow instructions provided above.  Drink enough water and fluids to keep your pee (urine) clear or pale yellow.  Get plenty of rest.  Return to work when your temperature is < 100 for 24 hours or as told by your doctor. You may use a face mask and wash your hands to stop your cold from spreading. GET HELP RIGHT AWAY IF:   After the first few days, you feel you are getting worse.  You have questions about your medicine.  You have chills, shortness of breath, or red spit (mucus).  You have pain in the face for more then 1-2 days, especially when you bend forward.  You have a fever, puffy (swollen) neck, pain when you swallow, or white spots in the back of your throat.  You have a bad headache, ear pain, sinus pain, or chest pain.  You have a high-pitched whistling sound when you breathe in and out (wheezing).  You cough up blood.  You have  a stiff neck. MAKE SURE YOU:   Understand these instructions.  Will watch your condition.  Will get help right away if you are not doing well or get worse. Document Released: 09/29/2007 Document Revised: 07/05/2011 Document Reviewed: 07/18/2013 [facility] Patient Information 2015 [place], [place]. This information is not intended to replace advice given to you by your health care provider. Make sure you discuss any questions you have with your health care provider.

## 2015-04-29 NOTE — Progress Notes (Signed)
HPI:  Robyn Medina is a pleasant 50 yo F, a prior patient of [facility], now scheduled to establish care with Dr. Yong Channel next month, here for an acute visit for:  URI: -started: the last few days -symptoms: nasal congestion, HA, PND, cough, body aches, fever > 100 this morning per her report - reports took tylenol this am -denies: SOB, wheezing, sinus pain, rash, NVD, foreign travel, tick bite  Dysuria: -started about 1 week ago -symptoms: pressure when urinates, frequency, urgency, dysuria -started period right after symptoms started - used yeast tx OTC -denies: hematuria, fevers until resp illness (see above), flank pain, NVD, vag symptoms  ROS: See pertinent positives and negatives per HPI.  Past Medical History  Diagnosis Date  . ALLERGIC RHINITIS 05/18/2007  . Headache(784.0) 03/03/2007  . PARESTHESIA 03/03/2007  . Sinusitis 09/24/2010    No past surgical history on file.  Family History  Problem Relation Age of Onset  . Hyperlipidemia Neg Hx     family hx  . Hypertension Neg Hx     family hx  . Heart disease Father   . Heart disease Paternal Uncle     Social History   Social History  . Marital Status: Married    Spouse Name: N/A  . Number of Children: N/A  . Years of Education: N/A   Social History Main Topics  . Smoking status: Never Smoker   . Smokeless tobacco: Never Used  . Alcohol Use: Yes  . Drug Use: No  . Sexual Activity: Not Asked   Other Topics Concern  . None   Social History Narrative     Current outpatient prescriptions:  .  atorvastatin (LIPITOR) 20 MG tablet, Take 1 tablet (20 mg total) by mouth daily., Disp: 90 tablet, Rfl: 3 .  Norgestimate-Ethinyl Estradiol Triphasic 0.18/0.215/0.25 MG-35 MCG tablet, Take 1 tablet by mouth daily. , Disp: , Rfl:  .  omeprazole (PRILOSEC) 40 MG capsule, Take 1 capsule (40 mg total) by mouth daily., Disp: 90 capsule, Rfl: 0 .  nitrofurantoin, macrocrystal-monohydrate, (MACROBID) 100 MG capsule, Take 1  capsule (100 mg total) by mouth 2 (two) times daily., Disp: 14 capsule, Rfl: 0  EXAM:  Filed Vitals:   04/29/15 1426  BP: 100/62  Pulse: 100  Temp: 98.7 F (37.1 C)    Body mass index is 24.09 kg/(m^2).  GENERAL: vitals reviewed and listed above, alert, oriented, appears well hydrated and in no acute distress  HEENT: atraumatic, conjunttiva clear, no obvious abnormalities on inspection of external nose and ears  NECK: no obvious masses on inspection  LUNGS: clear to auscultation bilaterally, no wheezes, rales or rhonchi, good air movement  CV: HRRR, no peripheral edema  ABD: no CVA TTP  MS: moves all extremities without noticeable abnormality  PSYCH: pleasant and cooperative, no obvious depression or anxiety  ASSESSMENT AND PLAN:  Discussed the following assessment and plan:  Influenza-like illness -likely VURI vs possible flu - no fever here and has not had tylenol since this am. She reports she had the flu vaccine. Rapid flu neg. Opted for supportive care and return/emergency precautions.  Dysuria -may have been related to period, udip c/w possible UTI, culture pending  - will tx withy macrobid with return precautions if persistent or worsening symptoms.  -Patient advised to return or notify a doctor immediately if symptoms worsen or persist or new concerns arise.  Patient Instructions  -Take the antibiotic as prescribed for the urinary tract infection  -follow up if worsening or not  improving as expected  INSTRUCTIONS FOR UPPER RESPIRATORY ILLNESS:  -plenty of rest and fluids  -nasal saline wash 2-3 times daily (use prepackaged nasal saline or bottled/distilled water if making your own)   -can use AFRIN nasal spray for drainage and nasal congestion - but do NOT use longer then 3-4 days  -can use tylenol (in no history of liver disease) or ibuprofen (if no history of kidney disease, bowel bleeding or significant heart disease) as directed for aches and  sorethroat  -in the winter time, using a humidifier at night is helpful (please follow cleaning instructions)  -if you are taking a cough medication - use only as directed, may also try a teaspoon of honey to coat the throat and throat lozenges. If given a cough medication with codeine or hydrocodone or other narcotic please be advised that this contains a strong and  potentially addicting medication. Please follow instructions carefully, take as little as possible and only use AS NEEDED for severe cough. Discuss potential side effects with your pharmacy. Please do not drive or operate machinery while taking these types of medications. Please do not take other sedating medications, drugs or alcohol while taking this medication without discussing with your doctor.  -for sore throat, salt water gargles can help  -follow up if you have fevers, facial pain, tooth pain, difficulty breathing or are worsening or symptoms persist longer then expected  Upper Respiratory Infection, Adult An upper respiratory infection (URI) is also known as the common cold. It is often caused by a type of germ (virus). Colds are easily spread (contagious). You can pass it to others by kissing, coughing, sneezing, or drinking out of the same glass. Usually, you get better in 1 to 3  weeks.  However, the cough can last for even longer. HOME CARE   Only take medicine as told by your doctor. Follow instructions provided above.  Drink enough water and fluids to keep your pee (urine) clear or pale yellow.  Get plenty of rest.  Return to work when your temperature is < 100 for 24 hours or as told by your doctor. You may use a face mask and wash your hands to stop your cold from spreading. GET HELP RIGHT AWAY IF:   After the first few days, you feel you are getting worse.  You have questions about your medicine.  You have chills, shortness of breath, or red spit (mucus).  You have pain in the face for more then 1-2 days,  especially when you bend forward.  You have a fever, puffy (swollen) neck, pain when you swallow, or white spots in the back of your throat.  You have a bad headache, ear pain, sinus pain, or chest pain.  You have a high-pitched whistling sound when you breathe in and out (wheezing).  You cough up blood.  You have  a stiff neck. MAKE SURE YOU:   Understand these instructions.  Will watch your condition.  Will get help right away if you are not doing well or get worse. Document Released: 09/29/2007 Document Revised: 07/05/2011 Document Reviewed: 07/18/2013 [facility] Patient Information 2015 [place], [place]. This information is not intended to replace advice given to you by your health care provider. Make sure you discuss any questions you have with your health care provider.            Colin Benton R.

## 2015-06-04 ENCOUNTER — Ambulatory Visit: Payer: BLUE CROSS/BLUE SHIELD | Admitting: Family Medicine

## 2015-06-18 ENCOUNTER — Encounter: Payer: Self-pay | Admitting: Family Medicine

## 2015-06-18 ENCOUNTER — Ambulatory Visit (INDEPENDENT_AMBULATORY_CARE_PROVIDER_SITE_OTHER): Payer: Managed Care, Other (non HMO) | Admitting: Family Medicine

## 2015-06-18 VITALS — BP 104/72 | HR 77 | Temp 98.1°F | Wt 137.0 lb

## 2015-06-18 DIAGNOSIS — Z23 Encounter for immunization: Secondary | ICD-10-CM | POA: Diagnosis not present

## 2015-06-18 DIAGNOSIS — R490 Dysphonia: Secondary | ICD-10-CM

## 2015-06-18 DIAGNOSIS — K219 Gastro-esophageal reflux disease without esophagitis: Secondary | ICD-10-CM | POA: Diagnosis not present

## 2015-06-18 DIAGNOSIS — E7800 Pure hypercholesterolemia, unspecified: Secondary | ICD-10-CM | POA: Diagnosis not present

## 2015-06-18 NOTE — Patient Instructions (Addendum)
Great to see you again!   We will call you within a week about your referral to ENT to evaluate ongoing hoarseness. If you do not hear within 2 weeks, give [place] a call.   Please have dermatology send me a note- I am very interested to hear their opinion on the lesion above your eye  Health Maintenance Due  Topic Date Due  . HIV Screening - whenever we do next labs or you can decline 07/16/1982  . TETANUS/TDAP - today? 07/16/1986  . PAP SMEAR -Sign release of information at the check out desk for records  07/15/1988

## 2015-06-18 NOTE — Progress Notes (Signed)
Robyn Reddish, MD Phone: [phone]  Subjective:  Patient presents today to establish care with me as their new primary care provider. Patient was formerly a patient of Dr. Megan Salon. Chief complaint-noted.   See problem oriented charting ROS- No chest pain or shortness of breath. No headache or blurry vision.   The following were reviewed and entered/updated in epic: Past Medical History  Diagnosis Date  . ALLERGIC RHINITIS 05/18/2007  . PARESTHESIA 03/03/2007    left arm numbness- resolved unclear cause  . Sinusitis 09/24/2010   Patient Active Problem List   Diagnosis Date Noted  . Pure hypercholesterolemia 07/05/2013    Priority: Medium  . GERD (gastroesophageal reflux disease) 06/18/2015    Priority: Low  . Allergic rhinitis 05/18/2007    Priority: Low   Past Surgical History  Procedure Laterality Date  . None      Family History  Problem Relation Age of Onset  . CAD Father 80    former smoker- stents  . Heart disease Paternal Uncle     several  . CAD Cousin     died 66 years old    Medications- reviewed and updated Current Outpatient Prescriptions  Medication Sig Dispense Refill  . atorvastatin (LIPITOR) 20 MG tablet Take 1 tablet (20 mg total) by mouth daily. 90 tablet 3  . Norgestimate-Ethinyl Estradiol Triphasic 0.18/0.215/0.25 MG-35 MCG tablet Take 1 tablet by mouth daily.     Marland Kitchen omeprazole (PRILOSEC) 40 MG capsule Take 1 capsule (40 mg total) by mouth daily. 90 capsule 0   Allergies-reviewed and updated No Known Allergies  Social History   Social History  . Marital Status: Married    Spouse Name: N/A  . Number of Children: N/A  . Years of Education: N/A   Social History Main Topics  . Smoking status: Never Smoker   . Smokeless tobacco: Never Used  . Alcohol Use: 1.8 oz/week    3 Standard drinks or equivalent per week  . Drug Use: No  . Sexual Activity: Not Asked   Other Topics Concern  . None   Social History Narrative   Married- Robyn Medina  for 30 years in 2017. 2 children- Robyn Medina and Robyn Medina.       Works at [employer]: walking, reading, eating    ROS--See HPI   Objective: BP 104/72 mmHg  Pulse 77  Temp(Src) 98.1 F (36.7 C)  Wt 137 lb (62.143 kg) Gen: NAD, resting comfortably HEENT: Mucous membranes are moist. Oropharynx normal Neck: no thyromegaly CV: RRR no murmurs rubs or gallops Lungs: CTAB no crackles, wheeze, rhonchi Abdomen: soft/nontender/nondistended/normal bowel sounds. No rebound or guarding.  Ext: no edema Skin: warm, dry, likely xanthoma on left upper lid Neuro: grossly normal, moves all extremities, PERRLA  Assessment/Plan:  Pure hypercholesterolemia S:Atorvastatin 20mg . 10 year risk 0.8% in 2016 but strong family. Dad with CAD/Stents age 81- long term former smoker. Several uncles with heart attacks on dads side. Patients chief concern for her health is heart health.  A/P: considering patient high level of concern for heart health and family history- can continue statin. May have xanthoma (seeing dermatology) though her lipids really are not tremendously high to cause this specifically triglycerides under 200, LDL around 140 and total 229 with HDL 54. Xanthoma unchanged despite statin.    GERD (gastroesophageal reflux disease) Hoarseness S:Prilosec 40mg  several years. Epigastric burning recurs off medicine going up to her throat. She also has had hoarseness on a daily basis with  any prolonged period of speech despite the prilosec. Nonsmoker with no history of throat cancer in family.  A/P: given 2 years of hoarseness that does not resolve on PPI- will refer to ENT for further evaluation.   Return in about 1 year (around 06/17/2016). update labs at that time.  Return precautions advised.   Orders Placed This Encounter  Procedures  . Tdap vaccine greater than or equal to 7yo IM  . Ambulatory referral to ENT    Referral Priority:  Routine    Referral Type:  Consultation     Referral Reason:  Specialty Services Required    Requested Specialty:  Otolaryngology    Number of Visits Requested:  1

## 2015-06-18 NOTE — Assessment & Plan Note (Signed)
S:Atorvastatin 20mg . 10 year risk 0.8% in 2016 but strong family. Dad with CAD/Stents age 48- long term former smoker. Several uncles with heart attacks on dads side. Patients chief concern for her health is heart health.  A/P: considering patient high level of concern for heart health and family history- can continue statin. May have xanthoma (seeing dermatology) though her lipids really are not tremendously high to cause this specifically triglycerides under 200, LDL around 140 and total 229 with HDL 54.

## 2015-06-18 NOTE — Assessment & Plan Note (Signed)
Hoarseness S:Prilosec 40mg  several years. Epigastric burning recurs off medicine going up to her throat. She also has had hoarseness on a daily basis with any prolonged period of speech despite the prilosec. Nonsmoker with no history of throat cancer in family.  A/P: given 2 years of hoarseness that does not resolve on PPI- will refer to ENT for further evaluation.

## 2015-08-01 ENCOUNTER — Encounter: Payer: Self-pay | Admitting: Family Medicine

## 2015-08-04 ENCOUNTER — Other Ambulatory Visit: Payer: Self-pay | Admitting: Family

## 2015-12-03 ENCOUNTER — Other Ambulatory Visit: Payer: Self-pay | Admitting: Adult Health

## 2015-12-03 NOTE — Telephone Encounter (Signed)
Dr. Hunter patient.  

## 2016-02-12 ENCOUNTER — Other Ambulatory Visit: Payer: Self-pay | Admitting: Internal Medicine

## 2016-03-01 ENCOUNTER — Other Ambulatory Visit: Payer: Self-pay | Admitting: Obstetrics

## 2016-03-01 DIAGNOSIS — Z1231 Encounter for screening mammogram for malignant neoplasm of breast: Secondary | ICD-10-CM

## 2016-03-10 ENCOUNTER — Other Ambulatory Visit: Payer: Self-pay | Admitting: Adult Health

## 2016-03-10 NOTE — Telephone Encounter (Signed)
Dr. Hunter patient.  

## 2016-03-22 ENCOUNTER — Ambulatory Visit
Admission: RE | Admit: 2016-03-22 | Discharge: 2016-03-22 | Disposition: A | Discharge status: Home / Self Care | Payer: Managed Care, Other (non HMO) | Source: Ambulatory Visit | Attending: Obstetrics | Admitting: Obstetrics

## 2016-03-22 DIAGNOSIS — Z1231 Encounter for screening mammogram for malignant neoplasm of breast: Secondary | ICD-10-CM

## 2016-05-17 ENCOUNTER — Ambulatory Visit (INDEPENDENT_AMBULATORY_CARE_PROVIDER_SITE_OTHER): Payer: Managed Care, Other (non HMO) | Admitting: Family Medicine

## 2016-05-17 ENCOUNTER — Other Ambulatory Visit: Payer: Self-pay

## 2016-05-17 ENCOUNTER — Encounter: Payer: Self-pay | Admitting: Family Medicine

## 2016-05-17 VITALS — BP 112/78 | HR 73 | Temp 98.5°F | Ht 64.0 in | Wt 146.8 lb

## 2016-05-17 DIAGNOSIS — M5441 Lumbago with sciatica, right side: Secondary | ICD-10-CM | POA: Diagnosis not present

## 2016-05-17 MED ORDER — TRAMADOL HCL 50 MG PO TABS: 50.0000 mg | ORAL_TABLET | Freq: Three times a day (TID) | ORAL | 0 refills | Status: DC | PRN

## 2016-05-17 MED ORDER — PREDNISONE 20 MG PO TABS: ORAL_TABLET | ORAL | 0 refills | Status: DC

## 2016-05-17 MED ORDER — CYCLOBENZAPRINE HCL 5 MG PO TABS: 5.0000 mg | ORAL_TABLET | Freq: Three times a day (TID) | ORAL | 0 refills | Status: DC | PRN

## 2016-05-17 MED ORDER — ATORVASTATIN CALCIUM 20 MG PO TABS: 20.0000 mg | ORAL_TABLET | Freq: Every day | ORAL | 2 refills | Status: DC

## 2016-05-17 NOTE — Progress Notes (Signed)
Subjective:  Robyn Medina is a 51 y.o. year old very pleasant female patient who presents for/with See problem oriented charting ROS- see ROS noted under subjective/HPI section   Past Medical History-  Patient Active Problem List   Diagnosis Date Noted  . Pure hypercholesterolemia 07/05/2013    Priority: Medium  . GERD (gastroesophageal reflux disease) 06/18/2015    Priority: Low  . Allergic rhinitis 05/18/2007    Priority: Low    Medications- reviewed and updated Current Outpatient Prescriptions  Medication Sig Dispense Refill  . atorvastatin (LIPITOR) 20 MG tablet Take 1 tablet (20 mg total) by mouth daily. 90 tablet 2  . Norgestimate-Ethinyl Estradiol Triphasic 0.18/0.215/0.25 MG-35 MCG tablet Take 1 tablet by mouth daily.     Marland Kitchen omeprazole (PRILOSEC) 40 MG capsule TAKE 1 CAPSULE (40 MG TOTAL) BY MOUTH DAILY. 90 capsule 3  . cyclobenzaprine (FLEXERIL) 5 MG tablet Take 1 tablet (5 mg total) by mouth 3 (three) times daily as needed for muscle spasms. 25 tablet 0  . predniSONE (DELTASONE) 20 MG tablet Take 2 pills for 3 days, 1 pill for 4 days 10 tablet 0  . traMADol (ULTRAM) 50 MG tablet Take 1 tablet (50 mg total) by mouth every 8 (eight) hours as needed. 15 tablet 0   No current facility-administered medications for this visit.     Objective: BP 112/78 (BP Location: Left Arm, Patient Position: Sitting, Cuff Size: Normal)   Pulse 73   Temp 98.5 F (36.9 C) (Oral)   Ht 5\' 4"  (1.626 m)   Wt 146 lb 12.8 oz (66.6 kg)   SpO2 98%   BMI 25.20 kg/m  Gen: NAD, resting comfortably CV: RRR no murmurs rubs or gallops Lungs: CTAB no crackles, wheeze, rhonchi Abdomen: soft/nontender/nondistended/normal bowel sounds. No rebound or guarding.  Ext: no edema Skin: warm, dry Neuro: grossly normal, moves all extremities   Back - Normal skin, Spine with normal alignment and no deformity.  No tenderness to vertebral process palpation.  Paraspinous muscles are tender and with spasm in right  low back.   Range of motion is full at neck and lumbar sacral regions. positive Straight leg raise on right, negative on left.  Neuro- no saddle anesthesia, 5/5 strength lower extremities, 2+ reflexes   Assessment/Plan:  Right low Back Pain with sciatica S:Pain rating, quality, Location-right low back radiating into right leg with 7/10 pain in each. Some mild pain onto left side at times and into left leg Started, duration-1 week ago, worse after unloading dishes last week. Not sure what triggered this but no fall or injury Worse with sitting Relieved by- better with laying Previous Treatment-aleve twice a day helps minorly, an old flexeril helped some Previous imaging-no, this is a new acute issue.   ROS-No saddle anesthesia, bladder incontinence, fecal incontinence, weakness in extremity, numbness or tingling in extremity. History negative for trauma, history of cancer, fever, chills, unintentional weight loss, recent bacterial infection, recent IV drug use, HIV, pain worse at night or while supine.  A/P: suspect herniated disk on right with positive straight leg raise. Will trial 7 day course of prednisone with flexeril for associated muscle spasm in low back as well as tramadol for more severe pain. If not improving in 7-10 days refer to sports medicine as that would be 2 weeks without improvement but did discuss common for acute low back pain even without sciatica to last up to 6 weeks.  Meds ordered this encounter  Medications  . predniSONE (DELTASONE) 20  MG tablet    Sig: Take 2 pills for 3 days, 1 pill for 4 days    Dispense:  10 tablet    Refill:  0  . cyclobenzaprine (FLEXERIL) 5 MG tablet    Sig: Take 1 tablet (5 mg total) by mouth 3 (three) times daily as needed for muscle spasms.    Dispense:  25 tablet    Refill:  0  . traMADol (ULTRAM) 50 MG tablet    Sig: Take 1 tablet (50 mg total) by mouth every 8 (eight) hours as needed.    Dispense:  15 tablet    Refill:  0  new  acute problem with medication management including high risk medication tramadol requiring counseling  Return precautions advised.  Garret Reddish, MD

## 2016-05-17 NOTE — Progress Notes (Signed)
Pre visit review using our clinic review tool, if applicable. No additional management support is needed unless otherwise documented below in the visit note. 

## 2016-05-17 NOTE — Patient Instructions (Signed)
Prednisone to reduce inflammation  Flexeril to help with muscle spasms. Could also try icing area or heat.   Tramadol to help with the pain   Do not drive for 8 hours after flexeril or tramadol  If not doing better by end of course by at least 50%- lets refer you to orthopedics or sports medicine   Herniated Disk A herniated disk is when a disk in your spine bulges out too far. There is a disk with a spongy center in between each pair of bones in the spine (vertebrae). These disks act as shock absorbers when you move. A herniated disk can cause pain and muscle weakness. This can happen anywhere in the back or neck. Follow these instructions at home: Medicines  Take over-the-counter and prescription medicines only as told by your doctor.  Do not drive or use heavy machinery while taking prescription pain medicine. Activity  Rest as told by your doctor.  After your rest period:  Return to your normal activities. Slowly start exercising as told by your doctor. Ask what activities are safe for you.  Use good posture.  Avoid movements that cause pain.  Do not lift anything that is heavier than 10 lb (4.5 kg) until your doctor says this is safe.  Do not sit or stand for a long time without moving.  Do not sit for a long time without getting up and moving around.  Do exercises (physical therapy) as told.  Try to strengthen your back and belly (abdomen) with exercises like crunches, swimming, or walking. General instructions  Do not use any products that contain nicotine or tobacco, such as cigarettes and e-cigarettes. If you need help quitting, ask your doctor.  Do not wear high-heeled shoes.  Do not sleep on your belly.  If you are overweight, work with your doctor to lose weight safely.  To prevent or treat constipation while you are taking prescription pain medicine, your doctor may recommend that you:  Drink enough fluid to keep your pee (urine) clear or pale  yellow.  Take over-the-counter or prescription medicines.  Eat foods that are high in fiber. These include fresh fruits and vegetables, whole grains, and beans.  Limit foods that are high in fat and processed sugars. These include fried and sweet foods.  Keep all follow-up visits as told by your doctor. This is important. How is this prevented?  Stay at a healthy weight.  Try to avoid stress.  Stay in shape. Do at least 150 minutes of moderate-intensity exercise each week, such as fast walking or water aerobics.  When lifting objects:  Keep your feet as far apart as your shoulders (shoulder-width apart) or farther apart.  Tighten your belly muscles.  Bend your knees and hips and keep your spine neutral. Lift using the strength of your legs, not your back. Do not lock your knees straight out.  Always ask for help to lift heavy or awkward objects. Contact a doctor if:  You have back pain or neck pain that does not get better after 6 weeks.  You have very bad pain.  You get any of these problems in any part of your body:  Tingling.  Weakness.  Loss of feeling (numbness). Get help right away if:  You cannot move your arms or legs.  You cannot control when you pee (urinate) or poop (have a bowel movement).  You feel dizzy.  You faint.  You have trouble breathing. This information is not intended to replace advice given  to you by your health care provider. Make sure you discuss any questions you have with your health care provider. Document Released: 08/27/2013 Document Revised: 12/10/2015 Document Reviewed: 10/09/2015 Elsevier Interactive Patient Education  2017 [employer].

## 2016-06-03 ENCOUNTER — Telehealth: Payer: Self-pay | Admitting: Family Medicine

## 2016-06-03 ENCOUNTER — Other Ambulatory Visit: Payer: Self-pay

## 2016-06-03 MED ORDER — OMEPRAZOLE 40 MG PO CPDR: DELAYED_RELEASE_CAPSULE | ORAL | 3 refills | Status: DC

## 2016-06-03 NOTE — Telephone Encounter (Signed)
Pt need new Rx for omeprazole  Pharm:  [facility]  [phone] (p)

## 2016-06-03 NOTE — Telephone Encounter (Signed)
Prescription sent to pharmacy as requested.

## 2016-06-15 ENCOUNTER — Other Ambulatory Visit: Payer: Self-pay | Admitting: Family Medicine

## 2016-06-15 ENCOUNTER — Telehealth: Payer: Self-pay

## 2016-06-15 NOTE — Telephone Encounter (Signed)
Called patient and left a voicemail message asking for a return phone call. She needs to be made aware that if this dose of prednisone does not help improve her pain then she needs to let us know so she can be referred to Sports Medicine.

## 2016-06-15 NOTE — Telephone Encounter (Signed)
Yes thanks, may fill but if she has continued issues after this 2nd course- need to get her into sports medicine or orthopedics.

## 2016-07-07 ENCOUNTER — Ambulatory Visit (INDEPENDENT_AMBULATORY_CARE_PROVIDER_SITE_OTHER): Payer: Managed Care, Other (non HMO) | Admitting: Family Medicine

## 2016-07-07 ENCOUNTER — Encounter: Payer: Self-pay | Admitting: Family Medicine

## 2016-07-07 VITALS — BP 118/94 | HR 80 | Temp 98.5°F | Ht 64.0 in | Wt 144.0 lb

## 2016-07-07 DIAGNOSIS — M5441 Lumbago with sciatica, right side: Secondary | ICD-10-CM

## 2016-07-07 NOTE — Progress Notes (Signed)
Subjective:  Robyn Medina is a 51 y.o. year old very pleasant female patient who presents for/with See problem oriented charting ROS- no incontinence, no leg weakness, no paresthesias. No fever or chills.    Past Medical History-  Patient Active Problem List   Diagnosis Date Noted  . Pure hypercholesterolemia 07/05/2013    Priority: Medium  . GERD (gastroesophageal reflux disease) 06/18/2015    Priority: Low  . Allergic rhinitis 05/18/2007    Priority: Low    Medications- reviewed and updated Current Outpatient Prescriptions  Medication Sig Dispense Refill  . atorvastatin (LIPITOR) 20 MG tablet Take 1 tablet (20 mg total) by mouth daily. 90 tablet 2  . cyclobenzaprine (FLEXERIL) 5 MG tablet Take 1 tablet (5 mg total) by mouth 3 (three) times daily as needed for muscle spasms. 25 tablet 0  . Norgestimate-Ethinyl Estradiol Triphasic 0.18/0.215/0.25 MG-35 MCG tablet Take 1 tablet by mouth daily.     Marland Kitchen omeprazole (PRILOSEC) 40 MG capsule TAKE 1 CAPSULE (40 MG TOTAL) BY MOUTH DAILY. 90 capsule 3  . traMADol (ULTRAM) 50 MG tablet Take 1 tablet (50 mg total) by mouth every 8 (eight) hours as needed. 15 tablet 0   No current facility-administered medications for this visit.     Objective: BP (!) 118/94 (BP Location: Left Arm, Patient Position: Sitting, Cuff Size: Normal)   Pulse 80   Temp 98.5 F (36.9 C) (Oral)   Ht 5\' 4"  (1.626 m)   Wt 144 lb (65.3 kg)   SpO2 96%   BMI 24.72 kg/m  Gen: NAD, resting comfortably CV: RRR no murmurs rubs or gallops Lungs: CTAB no crackles, wheeze, rhonchi Ext: no edema Skin: warm, dry Neuro: good strength in legs, no saddle anesthesia MSK: some pain in right buttocks, pushing over SI joint causes radiation of pain into right upper leg  Assessment/Plan:  Acute right-sided low back pain with right-sided sciatica - Plan: Ambulatory referral to Sports Medicine S: Seen early January- we had given 2 courses of prednisone and she feels better with  prednisone down to 3-4/10 down from 8/10. Denies stiffness.   Outside of the prednisone- Each morning pain when getting out of bed is so severe that she essentially crawls to kitchen. Pain is in her right leg mainly radiating down to ankle but not into foot. When she gets to kitchen and takes something for pain- about an hour after getting up can get to feeling better- usually aleve which gives her mild relief. Sleeps well at night without pain.   A/P: will continue aleve up to BID prn until sees Dr. Paulla Fore tomorrow. I had discussed with patient after first round of prednisone doing prednisone referral- we even retrialed a second time at her request. Patient's pain was so severe with straight leg raise last visitthat she requested I not repeat at this time as Dr. Paulla Fore may due tomorrow- suspect herniated disc high on differential. She asks me about ordering MRI and discussed first step likely would be x-ray which could be done at [facility].   BP mildly elevated likely due to pain  Orders Placed This Encounter  Procedures  . Ambulatory referral to Sports Medicine    Referral Priority:   Routine    Referral Type:   Consultation    Referred to Provider:   Gerda Diss, DO    Number of Visits Requested:   1   Return precautions advised.  Garret Reddish, MD

## 2016-07-07 NOTE — Patient Instructions (Addendum)
Dr. Paulla Fore will see you tomorrow. 07/08/16 @ 9:15am

## 2016-07-07 NOTE — Progress Notes (Signed)
Pre visit review using our clinic review tool, if applicable. No additional management support is needed unless otherwise documented below in the visit note. 

## 2016-07-08 ENCOUNTER — Ambulatory Visit (INDEPENDENT_AMBULATORY_CARE_PROVIDER_SITE_OTHER): Payer: Managed Care, Other (non HMO)

## 2016-07-08 ENCOUNTER — Encounter: Payer: Self-pay | Admitting: Sports Medicine

## 2016-07-08 ENCOUNTER — Ambulatory Visit (INDEPENDENT_AMBULATORY_CARE_PROVIDER_SITE_OTHER): Payer: Managed Care, Other (non HMO) | Admitting: Sports Medicine

## 2016-07-08 VITALS — BP 102/80 | HR 88 | Ht 64.0 in | Wt 143.0 lb

## 2016-07-08 DIAGNOSIS — M5441 Lumbago with sciatica, right side: Secondary | ICD-10-CM | POA: Diagnosis not present

## 2016-07-08 DIAGNOSIS — R938 Abnormal findings on diagnostic imaging of other specified body structures: Secondary | ICD-10-CM | POA: Diagnosis not present

## 2016-07-08 DIAGNOSIS — R9389 Abnormal findings on diagnostic imaging of other specified body structures: Secondary | ICD-10-CM

## 2016-07-08 MED ORDER — CYCLOBENZAPRINE HCL 5 MG PO TABS: 5.0000 mg | ORAL_TABLET | Freq: Every day | ORAL | 0 refills | Status: DC

## 2016-07-08 NOTE — Patient Instructions (Signed)
Please perform the exercise program that Robyn Medina has prepared for you and gone over in detail on a daily basis.  In addition to the handout you were provided you can access your program through: [email or web address]   Your unique program code is: [email or web address]

## 2016-07-08 NOTE — Progress Notes (Signed)
Robyn Medina - 51 y.o. female MRN [id number]  Date of birth: October 21, 1965  Office Visit Note: Visit Date: 07/08/2016 PCP: Garret Reddish, MD Referred by: Marin Olp, MD  Subjective: Chief Complaint  Patient presents with  . RT lower back and RT leg Pain    Approx 1 month ago pt bend over to grab a pan in her kitchen when she felt a grab in her RT lower back. Describes the pain as dull. Sx are mainly triggered when weight bearing---going from a sitting to standing position. Pt does walk with a limp. Pt completed a round of Prednisone last month with some relief. Aleternates Aleve and Ibuprofen with temporary relief--"gets me through the day." Has Tramadol on hand but only takes if she can't control pain with OTC meds.   HPI: Patient presents with the above symptoms.  Medications have only been minimally helpful.  Pt denies any change in bowel or bladder habits, muscle weakness, numbness or falls associated with this pain.  Denies fevers, chills, recent weight gain or weight loss.  No night sweats. No significant nighttime awakenings due to this issue. ROS:  Otherwise per HPI.  Objective:  VS:  HT:5\' 4"  (162.6 cm)   WT:143 lb (64.9 kg)  BMI:24.6    BP:102/80  HR:88bpm  TEMP: ( )  RESP:98 % Physical Exam: GENERAL:  WDWN, NAD, Non-toxic appearing PSYCH:  Alert & appropriately interactive  Not depressed or anxious appearing LOWER EXTREMITIES:  No significant rashes/lesions/ulcerations overlying the legs.  No significant pretibial edema.  No clubbing or cyanosis.  DP & PT pulses 2+/4.  Sensation intact to light touch. BACK:   + SLR on right  No significant midline tenderness.  TTP R sacral base  Good internal and external rotation of the hips.  Manual muscle testing is 5+/5 in BLE myotomes without focality  Lower extremity DTRs 2+/4 diffusely and symmetric  Marked tightness with psoas stretching on the right compared to the left.     Imaging & Procedures: Dg  Lumbar Spine Complete  Addendum Date: 07/08/2016   ADDENDUM REPORT: 07/08/2016 10:53 ADDENDUM: After discussing this case with Dr. Paulla Fore, the collection of calcifications in the right upper quadrant on the left oblique view may well represent small gallstones. Electronically Signed   By: Ivar Drape M.D.   On: 07/08/2016 10:53   Result Date: 07/08/2016 CLINICAL DATA:  Worsening low back pain radiating to the right lower extremity for 1 month, no injury EXAM: LUMBAR SPINE - COMPLETE 4+ VIEW COMPARISON:  None. FINDINGS: The lumbar vertebrae are in normal alignment. There is degenerative disc disease at L5-S1 where there is loss of disc space and mild spurring. The remainder of intervertebral disc spaces appear normal although there is mild degenerative change at L1-2 as well. No compression deformity is seen. On oblique views the facet joints are unremarkable. The SI joints appear well corticated. Faint calcifications present in the right upper quadrant probably costochondral, but small gallstones would be difficult to exclude. IMPRESSION: 1. Normal alignment. 2. Degenerative disc disease at L5-S1 and L1-2. No acute abnormality. Electronically Signed: By: Ivar Drape M.D. On: 07/08/2016 10:11   +++++++++++++++++++++++++++++++++++++++++++++++++++++++++++++++++++++++++++++  PROCEDURE NOTE: THERAPEUTIC EXERCISES (97110) 15 minutes spent for Therapeutic exercises as stated in above notes.  This included exercises focusing on stretching, strengthening, with significant focus on eccentric aspects.   Proper technique shown and discussed handout in great detail with ATC.  All questions were discussed and answered.    Assessment & Plan: Problem  List Items Addressed This Visit    Abnormal x-ray    Lumbar Spine with abnormal upper right sided calcifications likely gallstones vs calcific costochondral changes.  Will defer further eval at this time but low threshold for further imaging including MRI lumbar spine +/-  abdominal US       Acute bilateral low back pain with right-sided sciatica - Primary    Symptoms seem most consistent with psoas syndrome however persistent symptoms for this longer questionable as to whether this would be an isolated muscular etiology.  Given this we will try anti-inflammatories as well as therapeutic exercises as reviewed in detail with athletic training staff today and outlined per handout.  If any lack of improvement patient will call for MRI of her lumbar spine.  >50% of this 30 minute visit spent in direct patient counseling and/or coordination of care.  Discussion was focused on education regarding the in discussing the pathoetiology and anticipated clinical course of the above condition.  Additional time was spent in direct consultation with Dr. Alvester Chou with radiology.  Patient did call back prior to the node being completed an MRI will be ordered at her request.  We will plan follow-up with her after this is obtained.      Relevant Medications   cyclobenzaprine (FLEXERIL) 5 MG tablet   Other Relevant Orders   DG Lumbar Spine Complete (Completed)   MR Lumbar Spine Wo Contrast      Follow-up: Return in about 2 weeks (around 07/22/2016).   Past Medical/Family/Surgical/Social History: Medications & Allergies reviewed per EMR Patient Active Problem List   Diagnosis Date Noted  . Abnormal x-ray 07/12/2016  . Acute bilateral low back pain with right-sided sciatica 07/12/2016  . GERD (gastroesophageal reflux disease) 06/18/2015  . Pure hypercholesterolemia 07/05/2013  . Allergic rhinitis 05/18/2007   Past Medical History:  Diagnosis Date  . ALLERGIC RHINITIS 05/18/2007  . PARESTHESIA 03/03/2007   left arm numbness- resolved unclear cause  . Sinusitis 09/24/2010   Family History  Problem Relation Age of Onset  . CAD Father 80    former smoker- stents  . Heart disease Paternal Uncle     several  . CAD Cousin     died 2 years old   Past Surgical History:    Procedure Laterality Date  . none     Social History   Occupational History  . Not on file.   Social History Main Topics  . Smoking status: Never Smoker  . Smokeless tobacco: Never Used  . Alcohol use 1.8 oz/week    3 Standard drinks or equivalent per week  . Drug use: No  . Sexual activity: Not on file

## 2016-07-12 ENCOUNTER — Telehealth: Payer: Self-pay | Admitting: Sports Medicine

## 2016-07-12 DIAGNOSIS — R9389 Abnormal findings on diagnostic imaging of other specified body structures: Secondary | ICD-10-CM | POA: Insufficient documentation

## 2016-07-12 DIAGNOSIS — M5441 Lumbago with sciatica, right side: Secondary | ICD-10-CM | POA: Insufficient documentation

## 2016-07-12 MED ORDER — METHYLPREDNISOLONE 4 MG PO TBPK: ORAL_TABLET | ORAL | 0 refills | Status: DC

## 2016-07-12 NOTE — Telephone Encounter (Signed)
Rx for Medrol sent to [facility]. Order has been placed for MRI. Will need to follow up with patient 24 hours after MRI has been completed.

## 2016-07-12 NOTE — Telephone Encounter (Signed)
Patient has cyclobenzaprine (FLEXERIL) 5 MG tablet  and it's not working. Patient is requesting an MRI be scheduled at [place].   Please refill rx prednisone and send to  [id number] - [place], [place] - [address] [phone] (Phone) [phone] (Fax)   Please call patient on mobile phone to advise on both requests. Okay to leave a detailed message on patient's mobile number.

## 2016-07-12 NOTE — Assessment & Plan Note (Signed)
Lumbar Spine with abnormal upper right sided calcifications likely gallstones vs calcific costochondral changes.  Will defer further eval at this time but low threshold for further imaging including MRI lumbar spine +/- abdominal US

## 2016-07-12 NOTE — Telephone Encounter (Signed)
Called Patient and let them know that Rx was sent to Pharmacy today. Patient stated that she had received telephone call from [place] for an MRI on Sunday at [place]. I scheduled f/u for MRI review with Dr. Paulla Fore for Monday 07/19/2016 @330pm . Will follow up with patient on Monday if MRI report isn't ready for review.

## 2016-07-13 NOTE — Assessment & Plan Note (Signed)
Symptoms seem most consistent with psoas syndrome however persistent symptoms for this longer questionable as to whether this would be an isolated muscular etiology.  Given this we will try anti-inflammatories as well as therapeutic exercises as reviewed in detail with athletic training staff today and outlined per handout.  If any lack of improvement patient will call for MRI of her lumbar spine.  >50% of this 30 minute visit spent in direct patient counseling and/or coordination of care.  Discussion was focused on education regarding the in discussing the pathoetiology and anticipated clinical course of the above condition.  Additional time was spent in direct consultation with Dr. Alvester Chou with radiology.  Patient did call back prior to the node being completed an MRI will be ordered at her request.  We will plan follow-up with her after this is obtained.

## 2016-07-18 ENCOUNTER — Ambulatory Visit
Admission: RE | Admit: 2016-07-18 | Discharge: 2016-07-18 | Disposition: A | Discharge status: Home / Self Care | Payer: Managed Care, Other (non HMO) | Source: Ambulatory Visit | Attending: Sports Medicine | Admitting: Sports Medicine

## 2016-07-18 DIAGNOSIS — M5441 Lumbago with sciatica, right side: Secondary | ICD-10-CM

## 2016-07-19 ENCOUNTER — Ambulatory Visit (INDEPENDENT_AMBULATORY_CARE_PROVIDER_SITE_OTHER): Payer: Managed Care, Other (non HMO) | Admitting: Sports Medicine

## 2016-07-19 ENCOUNTER — Encounter: Payer: Self-pay | Admitting: Sports Medicine

## 2016-07-19 VITALS — BP 124/86 | HR 80 | Ht 64.0 in | Wt 144.2 lb

## 2016-07-19 DIAGNOSIS — M5126 Other intervertebral disc displacement, lumbar region: Secondary | ICD-10-CM

## 2016-07-19 DIAGNOSIS — R938 Abnormal findings on diagnostic imaging of other specified body structures: Secondary | ICD-10-CM | POA: Diagnosis not present

## 2016-07-19 DIAGNOSIS — M5441 Lumbago with sciatica, right side: Secondary | ICD-10-CM | POA: Diagnosis not present

## 2016-07-19 DIAGNOSIS — R9389 Abnormal findings on diagnostic imaging of other specified body structures: Secondary | ICD-10-CM

## 2016-07-19 MED ORDER — GABAPENTIN 300 MG PO CAPS: ORAL_CAPSULE | ORAL | 1 refills | Status: DC

## 2016-07-19 NOTE — Progress Notes (Signed)
OFFICE VISIT NOTE Robyn Medina. Robyn Medina, [place] at [place] - 51 y.o. female MRN [id number]  Date of birth: 06-14-1965  Visit Date: 07/19/2016  PCP: Garret Reddish, MD   Referred by: Marin Olp, MD  SUBJECTIVE:   Chief Complaint  Patient presents with  . pain in low back    bilateral   HPI: As above. Additional pertinent information includes:  Patient here for follow-up of MRI results from her most recent MRI of lumbar spine.  She continues to have pain going down the right posterior aspect of her leg.  She denies any changes in bowel or bladder.  No persistent nighttime awakenings.  No worrisome features on MRI.   ROS: ROS  Otherwise per HPI.  HISTORY & PERTINENT PRIOR DATA:  No specialty comments available. She reports that she has never smoked. She has never used smokeless tobacco. No results for input(s): HGBA1C, LABURIC in the last 8760 hours. Medications & Allergies reviewed per EMR Patient Active Problem List   Diagnosis Date Noted  . Herniation of right side of L4-L5 intervertebral disc 07/19/2016  . Abnormal x-ray 07/12/2016  . Acute bilateral low back pain with right-sided sciatica 07/12/2016  . GERD (gastroesophageal reflux disease) 06/18/2015  . Pure hypercholesterolemia 07/05/2013  . Allergic rhinitis 05/18/2007   Past Medical History:  Diagnosis Date  . ALLERGIC RHINITIS 05/18/2007  . PARESTHESIA 03/03/2007   left arm numbness- resolved unclear cause  . Sinusitis 09/24/2010   Family History  Problem Relation Age of Onset  . CAD Father 5    former smoker- stents  . Heart disease Paternal Uncle     several  . CAD Cousin     died 66 years old   Past Surgical History:  Procedure Laterality Date  . none     Social History   Occupational History  . Not on file.   Social History Main Topics  . Smoking status: Never Smoker  . Smokeless tobacco: Never Used  . Alcohol use  1.8 oz/week    3 Standard drinks or equivalent per week  . Drug use: No  . Sexual activity: Not on file    OBJECTIVE:  VS:  HT:5\' 4"  (162.6 cm)   WT:144 lb 3.2 oz (65.4 kg)  BMI:24.8    BP:124/86  HR:80bpm  TEMP: ( )  RESP:97 % Physical Exam  Constitutional: She appears well-developed and well-nourished. She is cooperative.  Non-toxic appearance.  HENT:  Head: Normocephalic and atraumatic.  Cardiovascular: Intact distal pulses.   Pulmonary/Chest: No accessory muscle usage. No respiratory distress.  Neurological: She is alert. She is not disoriented. She displays normal reflexes. No sensory deficit.  Skin: Skin is warm, dry and intact. Capillary refill takes less than 2 seconds. No abrasion and no rash noted.  Psychiatric: She has a normal mood and affect. Her speech is normal and behavior is normal. Thought content normal.  Back & Lower Extremities:  No significant rashes/lesions/ulcerations overlying the legs or back  No significant pretibial edema.  No LE clubbing or cyanosis.  DP & PT pulses 2+/4.  Generalized anesthesia in the right lower extremity without dermatomal distribution  Pain with straight leg raise on right  No significant midline tenderness.    Pain with popliteal compression test on the right and greater sciatic notch tenderness on the right  Good internal and external rotation of the hips.  Patient is able to heel and toe walk  without significant difficulty.  Manual muscle testing is 5+/5 in BLE myotomes without focality   IMAGING & PROCEDURES: Dg Lumbar Spine Complete  Addendum Date: 07/08/2016   ADDENDUM REPORT: 07/08/2016 10:53 ADDENDUM: After discussing this case with Dr. Paulla Fore, the collection of calcifications in the right upper quadrant on the left oblique view may well represent small gallstones. Electronically Signed   By: Ivar Drape M.D.   On: 07/08/2016 10:53   Result Date: 07/08/2016 CLINICAL DATA:  Worsening low back pain radiating to  the right lower extremity for 1 month, no injury EXAM: LUMBAR SPINE - COMPLETE 4+ VIEW COMPARISON:  None. FINDINGS: The lumbar vertebrae are in normal alignment. There is degenerative disc disease at L5-S1 where there is loss of disc space and mild spurring. The remainder of intervertebral disc spaces appear normal although there is mild degenerative change at L1-2 as well. No compression deformity is seen. On oblique views the facet joints are unremarkable. The SI joints appear well corticated. Faint calcifications present in the right upper quadrant probably costochondral, but small gallstones would be difficult to exclude. IMPRESSION: 1. Normal alignment. 2. Degenerative disc disease at L5-S1 and L1-2. No acute abnormality. Electronically Signed: By: Ivar Drape M.D. On: 07/08/2016 10:11   Mr Lumbar Spine Wo Contrast  Result Date: 07/18/2016 CLINICAL DATA:  Initial evaluation for sudden onset low back pain with right-sided leg pain. EXAM: MRI LUMBAR SPINE WITHOUT CONTRAST TECHNIQUE: Multiplanar, multisequence MR imaging of the lumbar spine was performed. No intravenous contrast was administered. COMPARISON:  Prior radiograph from 07/08/2016. FINDINGS: Segmentation: Normal segmentation. Lowest well-formed disc is labeled the L5-S1 level. Alignment: Vertebral bodies normally aligned with preservation of the normal lumbar lordosis. No listhesis. Vertebrae: Vertebral body heights maintained. Few scattered chronic endplate Schmorl's nodes noted at the superior endplate of J81 about the L1-2 interspace. No evidence for acute or chronic fracture. Signal intensity within the vertebral body bone marrow mildly heterogeneous without worrisome osseous lesion. No abnormal marrow edema. Conus medullaris: Extends to the L1 level and appears normal. Paraspinal and other soft tissues: Paraspinous soft tissues within normal limits. A round T2 hyperintense cyst partially visualize within the posterior right hepatic lobe, of  doubtful significance. Visualized visceral structures otherwise unremarkable. Disc levels: T11-12: Diffuse degenerative disc bulge with disc desiccation. Superimposed broad right paracentral disc protrusion with slight cephalad migration (series 2, image 7). No significant canal stenosis. Foramina are patent. T12-L1:  Unremarkable. L1-2: Diffuse degenerative disc bulge with disc desiccation. No stenosis. L2-3:  Unremarkable. L3-4:  Unremarkable. L4-5: Diffuse degenerative disc bulge with disc desiccation. There is a AE right subarticular disc extrusion with inferior migration (series 2, image 6). Extruded disc material extends into the right lateral recess, contacting and posterior displacing superimposed mild facet and ligamentum flavum hypertrophy. Mild bilateral L4 foraminal stenosis. The transiting right L5 nerve root (series 6, image 26). L5-S1: Diffuse degenerative disc bulge with disc desiccation. Superimposed central disc protrusion with mild caudad angulation. Associated annular fissure. Protruding disc closely approximates the descending S1 nerve roots without frank neural impingement. Mild lateral recess stenosis bilaterally. Superimposed mild facet hypertrophy. Mild to moderate bilateral L5 foraminal stenosis. IMPRESSION: 1. Subarticular disc extrusion at L4-5, impinging upon the descending right L5 nerve root in the right lateral recess. 2. Central disc protrusion at L5-S1, closely approximating the transiting S1 nerve roots without frank neural impingement. 3. Mild to moderate bilateral L5 foraminal stenosis related to disc bulge and facet hypertrophy. 4. Shallow right paracentral disc protrusion at T11-12 without  significant stenosis. Electronically Signed   By: Jeannine Boga M.D.   On: 07/18/2016 20:59   No additional findings.   ASSESSMENT & PLAN:  Visit Diagnoses:  1. Acute bilateral low back pain with right-sided sciatica   2. Herniation of right side of L4-L5 intervertebral disc     3. Abnormal x-ray    Meds:  Meds ordered this encounter  Medications  . gabapentin (NEURONTIN) 300 MG capsule    Sig: Start with 1 tab po qhs X 1 week, then increase to 1 tab po bid X 1 week then 1 tab po tid prn    Dispense:  90 capsule    Refill:  1    Orders:  Orders Placed This Encounter  Procedures  . Ambulatory referral to Physical Medicine Rehab    Follow-up: Return in about 6 weeks (around 08/30/2016).   Otherwise please see problem oriented charting as below.

## 2016-07-27 ENCOUNTER — Ambulatory Visit: Payer: Managed Care, Other (non HMO) | Admitting: Sports Medicine

## 2016-08-01 NOTE — Assessment & Plan Note (Signed)
Persistent symptoms in spite of conservative management.  Will start on gabapentin Medrol and refer to physical medicine for epidural steroid injection.   Ultimately she will benefit from therapeutic exercises and this will need to be reviewed with her given the acuity of her symptoms, previously reviewed home exercise program is appropriate

## 2016-08-01 NOTE — Assessment & Plan Note (Signed)
We will refer for epidural steroid injection with Dr. Ernestina Patches

## 2016-08-01 NOTE — Assessment & Plan Note (Signed)
MRI with reassuring findings consistent with degenerative changes explaining the right radicular symptoms.

## 2016-08-04 LAB — HM PAP SMEAR

## 2016-08-05 ENCOUNTER — Encounter: Payer: Self-pay | Admitting: Family Medicine

## 2016-08-30 ENCOUNTER — Ambulatory Visit (INDEPENDENT_AMBULATORY_CARE_PROVIDER_SITE_OTHER): Payer: Managed Care, Other (non HMO) | Admitting: Sports Medicine

## 2016-08-30 ENCOUNTER — Encounter: Payer: Self-pay | Admitting: Sports Medicine

## 2016-08-30 VITALS — BP 110/80 | HR 85 | Ht 64.0 in | Wt 143.0 lb

## 2016-08-30 DIAGNOSIS — M5126 Other intervertebral disc displacement, lumbar region: Secondary | ICD-10-CM

## 2016-08-30 DIAGNOSIS — M5441 Lumbago with sciatica, right side: Secondary | ICD-10-CM

## 2016-08-30 NOTE — Assessment & Plan Note (Signed)
Significant remarkable improvement with gabapentin and Medrol Dosepak. Encouraged her to continue with her home exercise program at least 3 days per week. She will attempt to wean off the gabapentin 4 weeks.  If any lack of improvement or recurrence of symptoms she will plan to follow-up.

## 2016-08-30 NOTE — Progress Notes (Signed)
OFFICE VISIT NOTE Robyn Medina. Robyn Medina, [place] at [place] - 51 y.o. female MRN [id number]  Date of birth: 04-Jun-1965  Visit Date: 08/30/2016  PCP: Marin Olp, MD   Referred by: Marin Olp, MD  Autumn McNeil,cma acting as scribe for Dr. Paulla Fore.  SUBJECTIVE:   Chief Complaint  Patient presents with  . Follow-up  . Acute bilateral low back pain with right-sided sciatica   HPI: As below and per problem based documentation when appropriate.  Robyn Medina reports improvement in her back pain approx 1 week after starting Gabapendtin. Currently taking 300mg  1qhs. Pt was performing daily exercises but since improvement she is only doing them 2 days per week. Occasionally mild pain in back.  No significant nighttime disturbances.     Review of Systems  Constitutional: Negative.   HENT: Negative.   Eyes: Negative.   Respiratory: Negative.   Cardiovascular: Negative.   Gastrointestinal: Negative.   Genitourinary: Negative.   Musculoskeletal: Negative.   Skin: Negative.   Neurological: Negative.   Endo/Heme/Allergies: Negative.   Psychiatric/Behavioral: Negative.     Otherwise per HPI.  HISTORY & PERTINENT PRIOR DATA:  No specialty comments available. She reports that she has never smoked. She has never used smokeless tobacco. No results for input(s): HGBA1C, LABURIC in the last 8760 hours. Medications & Allergies reviewed per EMR Patient Active Problem List   Diagnosis Date Noted  . Herniation of right side of L4-L5 intervertebral disc 07/19/2016  . Abnormal x-ray 07/12/2016  . Acute bilateral low back pain with right-sided sciatica 07/12/2016  . GERD (gastroesophageal reflux disease) 06/18/2015  . Pure hypercholesterolemia 07/05/2013  . Allergic rhinitis 05/18/2007   Past Medical History:  Diagnosis Date  . ALLERGIC RHINITIS 05/18/2007  . PARESTHESIA 03/03/2007   left arm numbness- resolved  unclear cause  . Sinusitis 09/24/2010   Family History  Problem Relation Age of Onset  . CAD Father 33    former smoker- stents  . Heart disease Paternal Uncle     several  . CAD Cousin     died 64 years old   Past Surgical History:  Procedure Laterality Date  . none     Social History   Occupational History  . Not on file.   Social History Main Topics  . Smoking status: Never Smoker  . Smokeless tobacco: Never Used  . Alcohol use 1.8 oz/week    3 Standard drinks or equivalent per week  . Drug use: No  . Sexual activity: Not on file    OBJECTIVE:  VS:  HT:5\' 4"  (162.6 cm)   WT:143 lb (64.9 kg)  BMI:24.6    BP:110/80  HR:85bpm  TEMP: ( )  RESP:97 % EXAM: No additional findings.  Adult female.  No acute distress.  Alert and appropriate. Bilateral lower extremities overall well aligned.  He walks unassisted in high heels.  Lower extremity strength testing is 5 out of 5 to all myotomes. No lower extremity dysesthesia.  Negative straight leg raise bilaterally.  No results found. ASSESSMENT & PLAN:  Visit Diagnoses:  1. Acute bilateral low back pain with right-sided sciatica   2. Herniation of right side of L4-L5 intervertebral disc    Problem List Items Addressed This Visit    Acute bilateral low back pain with right-sided sciatica - Primary    Significant remarkable improvement with gabapentin and Medrol Dosepak. Encouraged her to continue with her home exercise program at  least 3 days per week. She will attempt to wean off the gabapentin 4 weeks.  If any lack of improvement or recurrence of symptoms she will plan to follow-up.      Herniation of right side of L4-L5 intervertebral disc     Meds: No orders of the defined types were placed in this encounter.   Orders: No orders of the defined types were placed in this encounter.   Follow-up: No Follow-up on file.   CMA/ATC served as Education administrator during this visit. History, Physical, and Plan performed by medical  provider. Documentation and orders reviewed and attested to.      Teresa Coombs, [facility] Physician    08/30/2016 4:34 PM

## 2017-03-31 ENCOUNTER — Encounter: Payer: Self-pay | Admitting: Family Medicine

## 2017-03-31 ENCOUNTER — Ambulatory Visit (INDEPENDENT_AMBULATORY_CARE_PROVIDER_SITE_OTHER): Payer: Managed Care, Other (non HMO) | Admitting: Family Medicine

## 2017-03-31 VITALS — BP 124/82 | HR 93 | Temp 98.1°F | Ht 64.0 in | Wt 143.6 lb

## 2017-03-31 DIAGNOSIS — Z0001 Encounter for general adult medical examination with abnormal findings: Secondary | ICD-10-CM | POA: Diagnosis not present

## 2017-03-31 DIAGNOSIS — Z114 Encounter for screening for human immunodeficiency virus [HIV]: Secondary | ICD-10-CM | POA: Diagnosis not present

## 2017-03-31 DIAGNOSIS — E7800 Pure hypercholesterolemia, unspecified: Secondary | ICD-10-CM

## 2017-03-31 DIAGNOSIS — R21 Rash and other nonspecific skin eruption: Secondary | ICD-10-CM

## 2017-03-31 DIAGNOSIS — K219 Gastro-esophageal reflux disease without esophagitis: Secondary | ICD-10-CM

## 2017-03-31 MED ORDER — OMEPRAZOLE 20 MG PO CPDR: DELAYED_RELEASE_CAPSULE | ORAL | 3 refills | Status: DC

## 2017-03-31 MED ORDER — TRIAMCINOLONE ACETONIDE 0.1 % EX CREA: 1.0000 "application " | TOPICAL_CREAM | Freq: Two times a day (BID) | CUTANEOUS | 0 refills | Status: DC

## 2017-03-31 NOTE — Assessment & Plan Note (Signed)
HLD- on atorvastatin 20mg , update future fasting lipids

## 2017-03-31 NOTE — Progress Notes (Signed)
Phone: [phone]  Subjective:  Patient presents today for their annual physical. Chief complaint-noted.   See problem oriented charting- ROS- full  review of systems was completed and negative including No chest pain or shortness of breath. Does get some sinus headaches. No blurry vision. Rash as noted below  The following were reviewed and entered/updated in epic: Past Medical History:  Diagnosis Date  . ALLERGIC RHINITIS 05/18/2007  . PARESTHESIA 03/03/2007   left arm numbness- resolved unclear cause  . Sinusitis 09/24/2010   Patient Active Problem List   Diagnosis Date Noted  . Pure hypercholesterolemia 07/05/2013    Priority: Medium  . GERD (gastroesophageal reflux disease) 06/18/2015    Priority: Low  . Allergic rhinitis 05/18/2007    Priority: Low  . Herniation of right side of L4-L5 intervertebral disc 07/19/2016  . Abnormal x-ray 07/12/2016  . Acute bilateral low back pain with right-sided sciatica 07/12/2016   Past Surgical History:  Procedure Laterality Date  . none      Family History  Problem Relation Age of Onset  . CAD Father 52       former smoker- stents  . Heart disease Paternal Uncle        several  . CAD Cousin        died 60 years old    Medications- reviewed and updated Current Outpatient Medications  Medication Sig Dispense Refill  . atorvastatin (LIPITOR) 20 MG tablet Take 1 tablet (20 mg total) by mouth daily. 90 tablet 2  . cyclobenzaprine (FLEXERIL) 5 MG tablet Take 1 tablet (5 mg total) by mouth at bedtime.  0  . Norgestimate-Ethinyl Estradiol Triphasic 0.18/0.215/0.25 MG-35 MCG tablet Take 1 tablet by mouth daily.     Marland Kitchen omeprazole (PRILOSEC) 40 MG capsule TAKE 1 CAPSULE (40 MG TOTAL) BY MOUTH DAILY. 90 capsule 3  . traMADol (ULTRAM) 50 MG tablet Take 1 tablet (50 mg total) by mouth every 8 (eight) hours as needed. 15 tablet 0  . gabapentin (NEURONTIN) 300 MG capsule Take 300 mg by mouth once.     No current facility-administered  medications for this visit.     Allergies-reviewed and updated No Known Allergies  Social History   Socioeconomic History  . Marital status: Married    Spouse name: None  . Number of children: None  . Years of education: None  . Highest education level: None  Social Needs  . Financial resource strain: None  . Food insecurity - worry: None  . Food insecurity - inability: None  . Transportation needs - medical: None  . Transportation needs - non-medical: None  Occupational History  . None  Tobacco Use  . Smoking status: Never Smoker  . Smokeless tobacco: Never Used  Substance and Sexual Activity  . Alcohol use: Yes    Alcohol/week: 1.8 oz    Types: 3 Standard drinks or equivalent per week  . Drug use: No  . Sexual activity: None  Other Topics Concern  . None  Social History Narrative   Married- [name] for 30 years in 2017. 2 children- [name] 23 and [name] 28.       Works at [employer]: walking, reading, eating    Objective: BP 124/82 (BP Location: Left Arm, Patient Position: Sitting, Cuff Size: Normal)   Pulse 93   Temp 98.1 F (36.7 C) (Oral)   Ht 5\' 4"  (1.626 m)   Wt 143 lb 9.6 oz (65.1 kg)   LMP 03/23/2017  SpO2 97%   BMI 24.65 kg/m  Gen: NAD, resting comfortably HEENT: Mucous membranes are moist. Oropharynx normal Neck: no thyromegaly CV: RRR no murmurs rubs or gallops Lungs: CTAB no crackles, wheeze, rhonchi Abdomen: soft/nontender/nondistended/normal bowel sounds. No rebound or guarding.  Ext: no edema Skin: warm, dry Neuro: grossly normal, moves all extremities, PERRLA  Assessment/Plan:  51 y.o. female presenting for annual physical.  Health Maintenance counseling: 1. Anticipatory guidance: Patient counseled regarding regular dental exams q6 months, eye exams - yearly, wearing seatbelts.  2. Risk factor reduction:  Advised patient of need for regular exercise and diet rich and fruits and vegetables to reduce risk of heart  attack and stroke. Exercise- not exercising lately- admits to laziness- encouraged 150 minutes a week. Diet-admits to overeating, luckily has not gained weight- knows she needs to reverse this trend.  Wt Readings from Last 3 Encounters:  03/31/17 143 lb 9.6 oz (65.1 kg)  08/30/16 143 lb (64.9 kg)  07/19/16 144 lb 3.2 oz (65.4 kg)  3. Immunizations/screenings/ancillary studies- discuss shingrix next year as will be 52 Immunization History  Administered Date(s) Administered  . Influenza Split 05/25/2012  . Influenza,inj,Quad PF,6+ Mos 01/09/2013  . Influenza-Unspecified 02/18/2016, 01/25/2017  . Tdap 06/18/2015  4. Cervical cancer screening-  [facility]. 07/16/14 pap with 5 year repeat with negative HPV. Also done 08/04/16 and this was negative as well. Listed as 3 year follow up They also plan to stop birth control within the year, but may wait until 74 or so.  5. Breast cancer screening-  breast exam with gynecology and mammogram 03/22/16- she will call to schedule update 6. Colon cancer screening - will order at next years physical or if see after 52th birthday 28. Skin cancer screening- no dermatologist. advised regular sunscreen use. Denies worrisome, changing, or new skin lesions.  8. Osteoporosis screening at 27- will plan on this   Status of chronic or acute concerns   Rare use of flexeril  Rash S:3 weeks rash- both arms, one spot on abdomen, some on upper back. Papules. Pruritic. Thought may just be dry skin. Several papules in these areas- excoriated tops of lesions.   Denies new fabric softeners, soaps, clothes.   ROS-not ill appearing, no fever/chills. No new medications. Not immunocompromised. No mucus membrane involvement.  A/P: unclear cause. She appears to be in itch-scratch cycle. Will try to break this with week of triamcinolone. Should follow up with [place] if not improved.   Pure hypercholesterolemia HLD- on atorvastatin 20mg , update future fasting lipids  GERD  (gastroesophageal reflux disease) GERD- on omeprazole 40mg . Discussed trial reduction to 20mg - she is agreeable to try. Did have recurrence off omeprazole completely in past.   Future Appointments  Date Time Provider [place]  04/05/2017  8:15 AM [facility]   Return in about 1 year (around 03/31/2018) for physical.  Orders Placed This Encounter  Procedures  . Lipid panel    Standing Status:   Future    Standing Expiration Date:   03/31/2018  . CBC    Standing Status:   Future    Standing Expiration Date:   03/31/2018  . Comprehensive metabolic panel    [place]    Standing Status:   Future    Standing Expiration Date:   03/31/2018  . HIV antibody    Standing Status:   Future    Standing Expiration Date:   03/31/2018    Meds ordered this encounter  Medications  . triamcinolone cream (  KENALOG) 0.1 %    Sig: Apply 1 application topically 2 (two) times daily. For 7-10 days maximum    Dispense:  80 g    Refill:  0  . omeprazole (PRILOSEC) 20 MG capsule    Sig: TAKE 1 CAPSULE (40 MG TOTAL) BY MOUTH DAILY.    Dispense:  90 capsule    Refill:  3    Return precautions advised.  Garret Reddish, MD

## 2017-03-31 NOTE — Assessment & Plan Note (Signed)
GERD- on omeprazole 40mg . Discussed trial reduction to 20mg - she is agreeable to try. Did have recurrence off omeprazole completely in past.

## 2017-03-31 NOTE — Patient Instructions (Addendum)
Please call to schedule your mammogram  Consider calling for counseling to discuss anxiety. I have had several patients benefit from this  Schedule a lab visit at the check out desk within 2 weeks. Return for future fasting labs meaning nothing but water after midnight please. Ok to take your medications with water.

## 2017-04-05 ENCOUNTER — Other Ambulatory Visit: Payer: Managed Care, Other (non HMO)

## 2017-04-08 ENCOUNTER — Telehealth: Payer: Self-pay | Admitting: Family Medicine

## 2017-04-08 NOTE — Telephone Encounter (Signed)
Should be 20mg - sorry about that- can send in new rx

## 2017-04-08 NOTE — Telephone Encounter (Signed)
Copied from [place]. Topic: Quick Communication - See Telephone Encounter >> Apr 08, 2017  8:55 AM Aurelio Brash B wrote: CRM for notification. See Telephone encounter for:  Ronny Bacon from [facility] needs clarification on dosage for a rx,  omeprazole 20 mg says TAKE 1 CAPSULE (40 MG TOTAL) BY MOUTH DAILY. Natasha's phone number is [phone]  The reference number is [id number] 04/08/17.

## 2017-04-11 ENCOUNTER — Other Ambulatory Visit: Payer: Self-pay | Admitting: *Deleted

## 2017-04-11 DIAGNOSIS — Z0001 Encounter for general adult medical examination with abnormal findings: Secondary | ICD-10-CM

## 2017-04-11 DIAGNOSIS — E7800 Pure hypercholesterolemia, unspecified: Secondary | ICD-10-CM

## 2017-04-11 MED ORDER — OMEPRAZOLE 20 MG PO CPDR: DELAYED_RELEASE_CAPSULE | ORAL | 3 refills | Status: DC

## 2017-04-13 ENCOUNTER — Other Ambulatory Visit (INDEPENDENT_AMBULATORY_CARE_PROVIDER_SITE_OTHER): Payer: Managed Care, Other (non HMO)

## 2017-04-13 DIAGNOSIS — E7800 Pure hypercholesterolemia, unspecified: Secondary | ICD-10-CM | POA: Diagnosis not present

## 2017-04-13 DIAGNOSIS — Z0001 Encounter for general adult medical examination with abnormal findings: Secondary | ICD-10-CM

## 2017-04-13 DIAGNOSIS — Z114 Encounter for screening for human immunodeficiency virus [HIV]: Secondary | ICD-10-CM

## 2017-04-13 LAB — LIPID PANEL
Cholesterol: 125 mg/dL (ref 0–200)
HDL: 36.9 mg/dL — ABNORMAL LOW (ref >39.00)
LDL Cholesterol: 62 mg/dL (ref 0–99)
NonHDL: 87.86
Total CHOL/HDL Ratio: 3
Triglycerides: 127 mg/dL (ref 0.0–149.0)
VLDL: 25.4 mg/dL (ref 0.0–40.0)

## 2017-04-13 LAB — COMPREHENSIVE METABOLIC PANEL
ALT: 14 U/L (ref 0–35)
AST: 11 U/L (ref 0–37)
Albumin: 3.5 g/dL (ref 3.5–5.2)
Alkaline Phosphatase: 58 U/L (ref 39–117)
BUN: 10 mg/dL (ref 6–23)
CO2: 26 mEq/L (ref 19–32)
Calcium: 8.7 mg/dL (ref 8.4–10.5)
Chloride: 107 mEq/L (ref 96–112)
Creatinine, Ser: 0.58 mg/dL (ref 0.40–1.20)
GFR: 117.08 mL/min (ref >60.00)
Glucose, Bld: 92 mg/dL (ref 70–99)
Potassium: 5 mEq/L (ref 3.5–5.1)
Sodium: 138 mEq/L (ref 135–145)
Total Bilirubin: 0.5 mg/dL (ref 0.2–1.2)
Total Protein: 6.1 g/dL (ref 6.0–8.3)

## 2017-04-13 LAB — CBC
HCT: 40 % (ref 36.0–46.0)
Hemoglobin: 13.4 g/dL (ref 12.0–15.0)
MCHC: 33.4 g/dL (ref 30.0–36.0)
MCV: 86.7 fl (ref 78.0–100.0)
Platelets: 275 10*3/uL (ref 150.0–400.0)
RBC: 4.61 Mil/uL (ref 3.87–5.11)
RDW: 12.3 % (ref 11.5–15.5)
WBC: 6.2 10*3/uL (ref 4.0–10.5)

## 2017-04-14 LAB — HIV ANTIBODY (ROUTINE TESTING W REFLEX): HIV 1&2 Ab, 4th Generation: NONREACTIVE

## 2017-04-15 ENCOUNTER — Other Ambulatory Visit: Payer: Self-pay

## 2017-04-15 MED ORDER — ATORVASTATIN CALCIUM 20 MG PO TABS: 20.0000 mg | ORAL_TABLET | Freq: Every day | ORAL | 3 refills | Status: DC

## 2017-05-03 ENCOUNTER — Other Ambulatory Visit: Payer: Self-pay | Admitting: Family Medicine

## 2017-05-03 DIAGNOSIS — Z1231 Encounter for screening mammogram for malignant neoplasm of breast: Secondary | ICD-10-CM

## 2017-05-26 ENCOUNTER — Ambulatory Visit: Payer: Managed Care, Other (non HMO)

## 2017-05-30 ENCOUNTER — Ambulatory Visit
Admission: RE | Admit: 2017-05-30 | Discharge: 2017-05-30 | Disposition: A | Discharge status: Home / Self Care | Payer: 59 | Source: Ambulatory Visit | Attending: Family Medicine | Admitting: Family Medicine

## 2017-05-30 DIAGNOSIS — Z1231 Encounter for screening mammogram for malignant neoplasm of breast: Secondary | ICD-10-CM

## 2017-11-12 IMAGING — MR MR LUMBAR SPINE W/O CM
4 of 5 series · 21 of 48 positions shown · non-contrast
Comparison: Prior radiograph from 07/08/2016.

CLINICAL DATA: Initial evaluation for sudden onset low back pain
with right-sided leg pain.

EXAM:
MRI LUMBAR SPINE WITHOUT CONTRAST
TECHNIQUE: Multiplanar, multisequence MR imaging of the lumbar spine was
performed. No intravenous contrast was administered.

[Series 2: T2 · sagittal · 4.0mm · 0.41mm/px · 6 of 12 slices shown (1 of 2)]
[im 1/12]
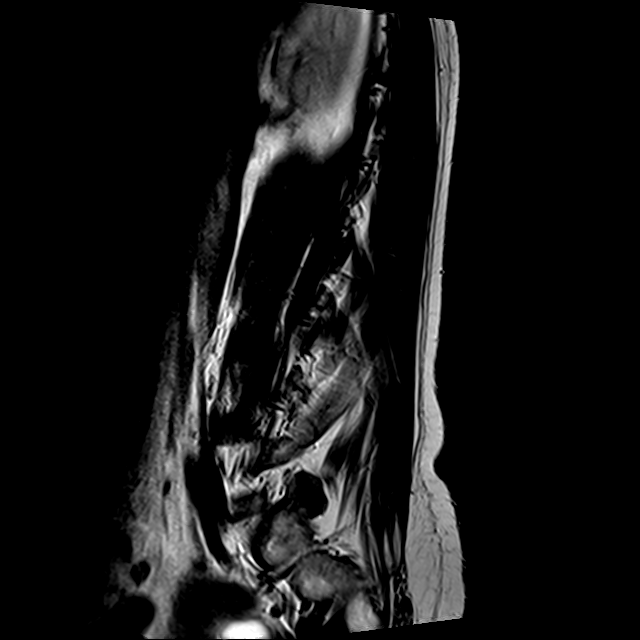
[im 3/12]
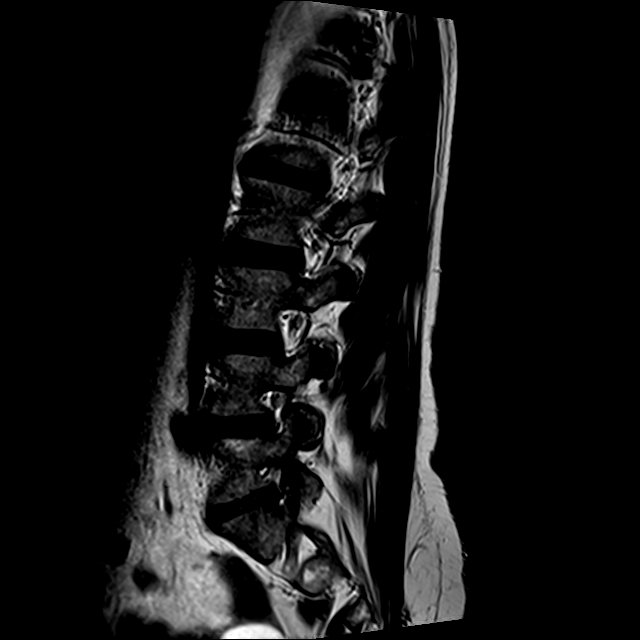
[im 5/12]
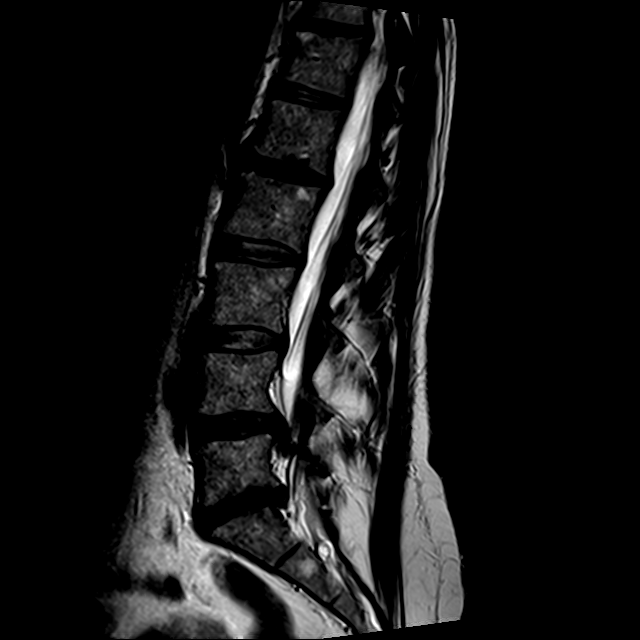
[im 7/12]
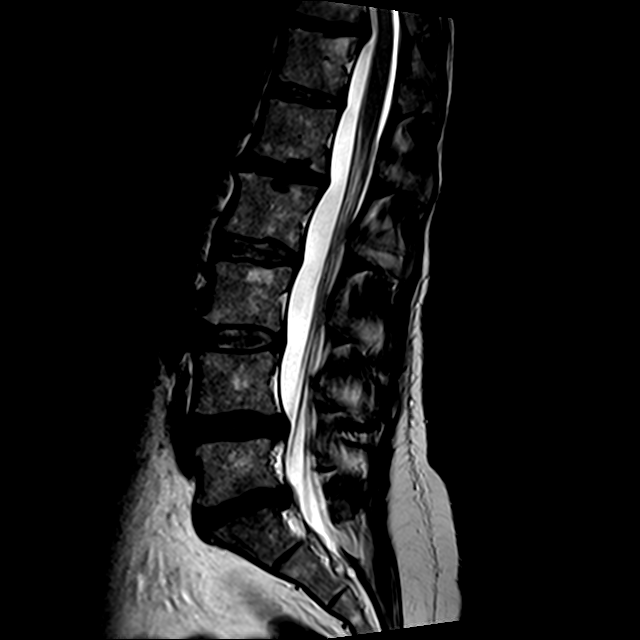
[im 9/12]
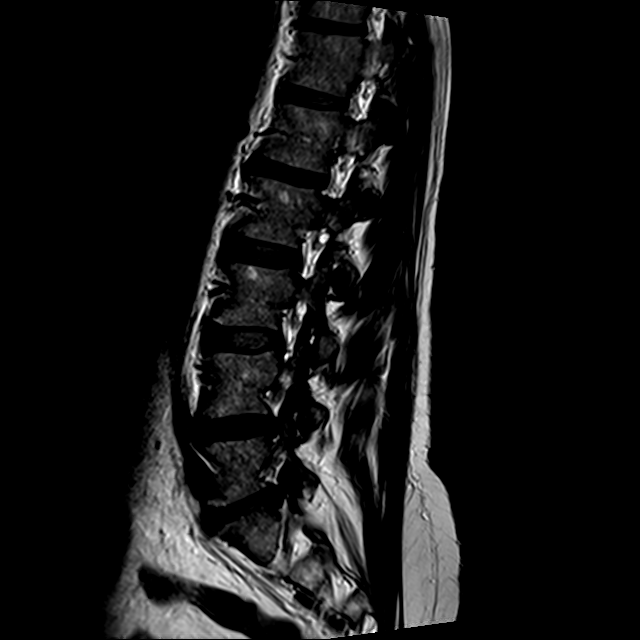
[im 12/12]
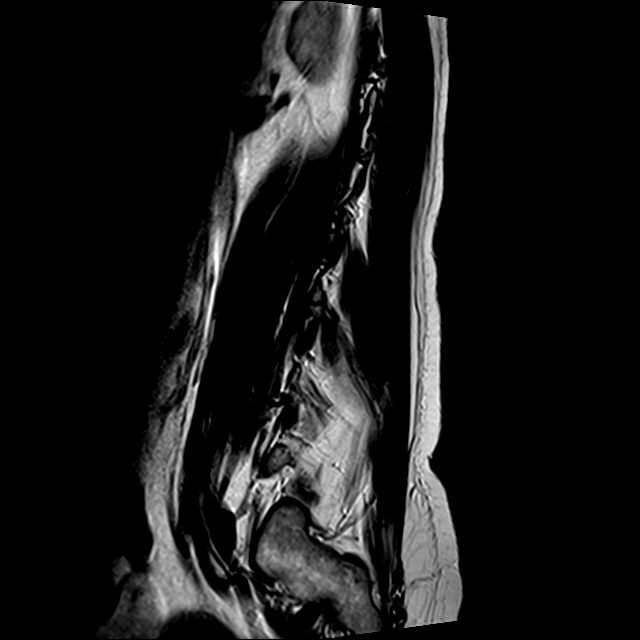

[Series 4: T1 · axial · 4.0mm · 0.37mm/px · z∈[-69,+76]mm · 3 of 32 slices shown (1 of 2)]
[im 5/32]
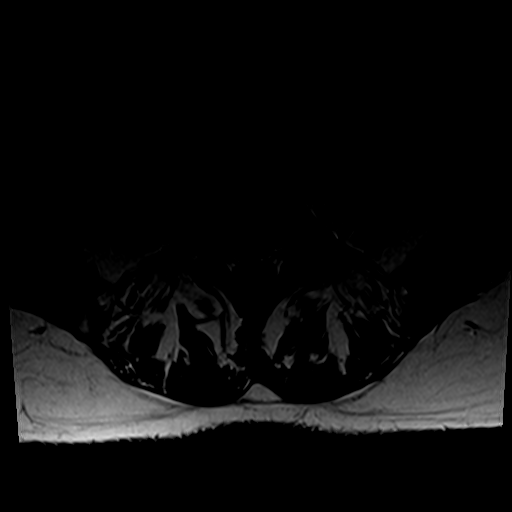
[im 16/32]
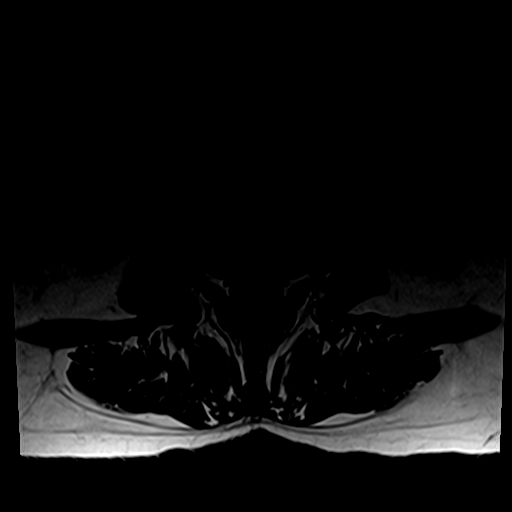
[im 27/32]
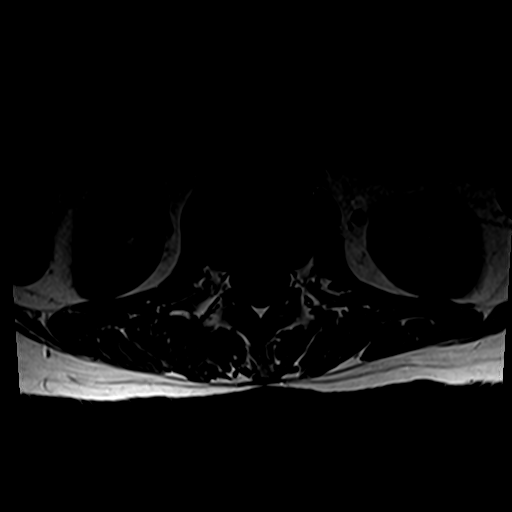

[Series 5: T1 · sagittal · 4.0mm · 0.51mm/px · 3 of 12 slices shown (2 of 2)]
[im 3/12]
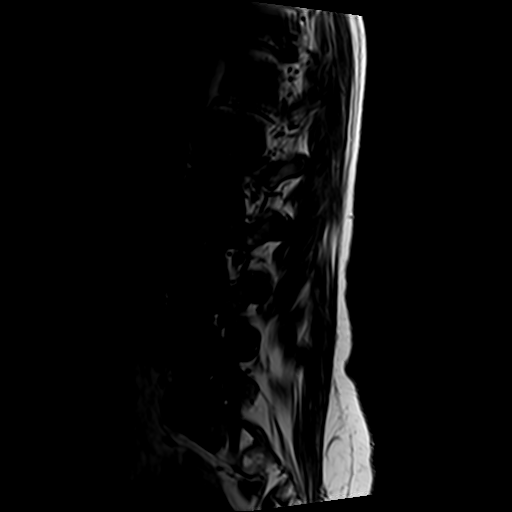
[im 7/12]
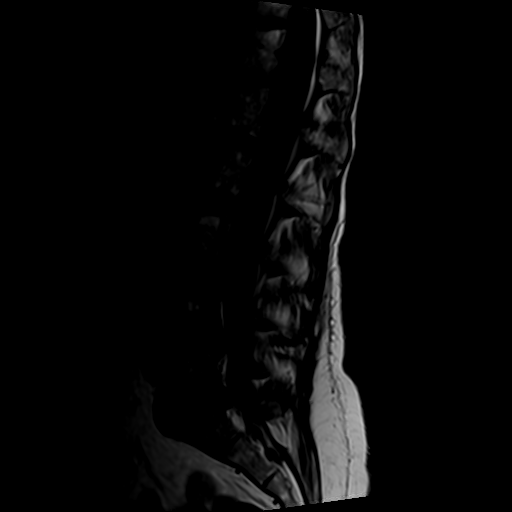
[im 12/12]
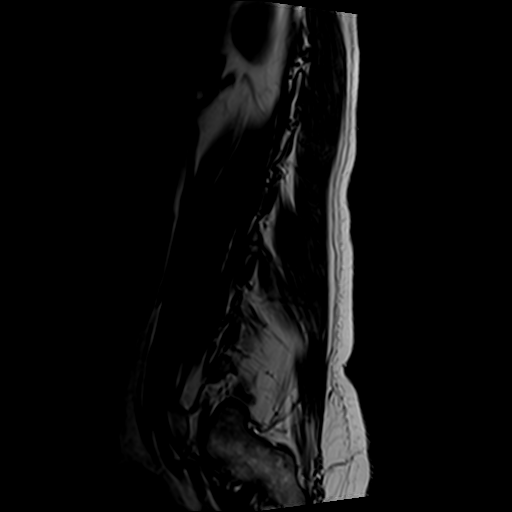

[Series 6: T2 · axial · 4.0mm · 0.74mm/px · z∈[-89,+138]mm · 9 of 32 slices shown (2 of 2)]
[im 1/32]
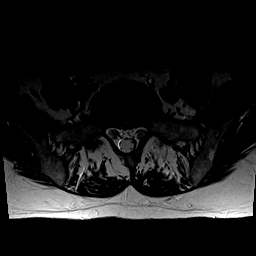
[im 5/32]
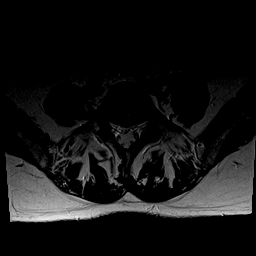
[im 9/32]
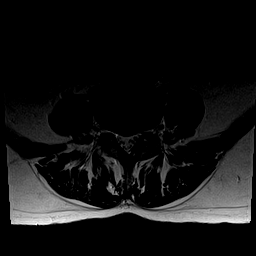
[im 14/32]
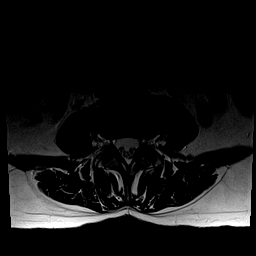
[im 16/32]
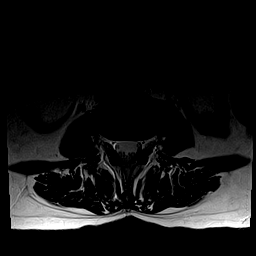
[im 18/32]
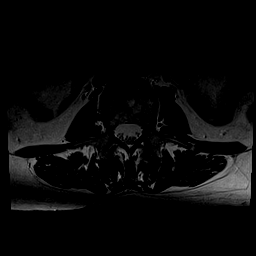
[im 23/32]
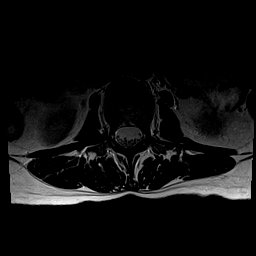
[im 27/32]
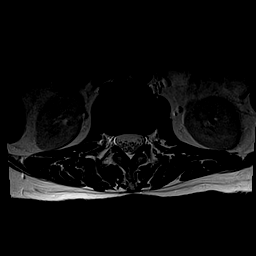
[im 32/32]
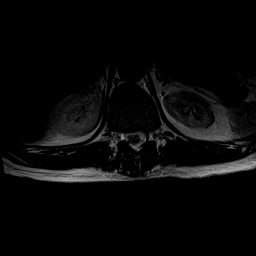

[21 of 48 positions shown; findings below may reference images not displayed]

FINDINGS: Segmentation: Normal segmentation. Lowest well-formed disc is
labeled the L5-S1 level.

Alignment: Vertebral bodies normally aligned with preservation of
the normal lumbar lordosis. No listhesis.

Vertebrae: Vertebral body heights maintained. Few scattered chronic
endplate Schmorl's nodes noted at the superior endplate of T12 about
the L1-2 interspace. No evidence for acute or chronic fracture.
Signal intensity within the vertebral body bone marrow mildly
heterogeneous without worrisome osseous lesion. No abnormal marrow
edema.

Conus medullaris: Extends to the L1 level and appears normal.

Paraspinal and other soft tissues: Paraspinous soft tissues within
normal limits. A round T2 hyperintense cyst partially visualize
within the posterior right hepatic lobe, of doubtful significance.
Visualized visceral structures otherwise unremarkable.

Disc levels:

T11-12: Diffuse degenerative disc bulge with disc desiccation.
Superimposed broad right paracentral disc protrusion with slight
cephalad migration (series 2, image 7). No significant canal
stenosis. Foramina are patent.

T12-L1:  Unremarkable.

L1-2: Diffuse degenerative disc bulge with disc desiccation. No
stenosis.

L2-3:  Unremarkable.

L3-4:  Unremarkable.

L4-5: Diffuse degenerative disc bulge with disc desiccation. There
is a AE right subarticular disc extrusion with inferior migration
(series 2, image 6). Extruded disc material extends into the right
lateral recess, contacting and posterior displacing superimposed
mild facet and ligamentum flavum hypertrophy. Mild bilateral L4
foraminal stenosis. The transiting right L5 nerve root (series 6,
image 26).

L5-S1: Diffuse degenerative disc bulge with disc desiccation.
Superimposed central disc protrusion with mild caudad angulation.
Associated annular fissure. Protruding disc closely approximates the
descending S1 nerve roots without frank neural impingement. Mild
lateral recess stenosis bilaterally. Superimposed mild facet
hypertrophy. Mild to moderate bilateral L5 foraminal stenosis.
IMPRESSION: 1. Subarticular disc extrusion at L4-5, impinging upon the
descending right L5 nerve root in the right lateral recess.
2. Central disc protrusion at L5-S1, closely approximating the
transiting S1 nerve roots without frank neural impingement.
3. Mild to moderate bilateral L5 foraminal stenosis related to disc
bulge and facet hypertrophy.
4. Shallow right paracentral disc protrusion at T11-12 without
significant stenosis.

## 2018-05-06 ENCOUNTER — Other Ambulatory Visit: Payer: Self-pay | Admitting: Family Medicine

## 2018-05-06 DIAGNOSIS — Z0001 Encounter for general adult medical examination with abnormal findings: Secondary | ICD-10-CM

## 2018-05-06 DIAGNOSIS — E7800 Pure hypercholesterolemia, unspecified: Secondary | ICD-10-CM

## 2018-05-15 ENCOUNTER — Other Ambulatory Visit: Payer: Self-pay

## 2018-05-15 ENCOUNTER — Ambulatory Visit: Payer: BLUE CROSS/BLUE SHIELD | Admitting: Family Medicine

## 2018-05-15 ENCOUNTER — Encounter: Payer: Self-pay | Admitting: Family Medicine

## 2018-05-15 VITALS — BP 110/80 | HR 79 | Temp 97.8°F | Ht 64.0 in | Wt 133.0 lb

## 2018-05-15 DIAGNOSIS — R5383 Other fatigue: Secondary | ICD-10-CM | POA: Diagnosis not present

## 2018-05-15 DIAGNOSIS — Z0000 Encounter for general adult medical examination without abnormal findings: Secondary | ICD-10-CM

## 2018-05-15 DIAGNOSIS — Z1211 Encounter for screening for malignant neoplasm of colon: Secondary | ICD-10-CM | POA: Diagnosis not present

## 2018-05-15 DIAGNOSIS — K219 Gastro-esophageal reflux disease without esophagitis: Secondary | ICD-10-CM | POA: Diagnosis not present

## 2018-05-15 DIAGNOSIS — E782 Mixed hyperlipidemia: Secondary | ICD-10-CM | POA: Diagnosis not present

## 2018-05-15 NOTE — Addendum Note (Signed)
Addended by: Elmer Picker on: 05/15/2018 10:01 AM   Modules accepted: Orders

## 2018-05-15 NOTE — Patient Instructions (Signed)
It was nice to meet you today! Keep up with healthy changes!  Why is Exercise Important? If I told you I had a single pill that would help you decrease stress by improving anxiety, decreasing depression, help you achieve a healthy weight, give you more energy, make you more productive, help you focus, decrease your risk of dementia/heart attack/stroke/falls, improve your bone health, and more would you be interested? These are just some of the benefits that exercise brings to you. IT IS WORTH carving out some time every day to fit in exercise. It will help in every aspect of your health. Even if you have injuries that prevent you from participating in a type of exercise you used to do; there is always something that you can do to keep exercise a part of your life. If improving your health is important, make exercise your priority. It is worth the time! If you have questions about the type of exercise that is right for you, please talk with me about this!     Exercising to Stay Healthy  Exercising regularly is important. It has many health benefits, such as:  Improving your overall fitness, flexibility, and endurance.  Increasing your bone density.  Helping with weight control.  Decreasing your body fat.  Increasing your muscle strength.  Reducing stress and tension.  Improving your overall health.   In order to become healthy and stay healthy, it is recommended that you do moderate-intensity and vigorous-intensity exercise. You can tell that you are exercising at a moderate intensity if you have a higher heart rate and faster breathing, but you are still able to hold a conversation. You can tell that you are exercising at a vigorous intensity if you are breathing much harder and faster and cannot hold a conversation while exercising. How often should I exercise? Choose an activity that you enjoy and set realistic goals. Your health care provider can help you to make an activity plan that  works for you. Exercise regularly as directed by your health care provider. This may include:  Doing resistance training twice each week, such as: ? Push-ups. ? Sit-ups. ? Lifting weights. ? Using resistance bands.  Doing a given intensity of exercise for a given amount of time. Choose from these options: ? 150 minutes of moderate-intensity exercise every week. ? 75 minutes of vigorous-intensity exercise every week. ? A mix of moderate-intensity and vigorous-intensity exercise every week.   Children, pregnant women, people who are out of shape, people who are overweight, and older adults may need to consult a health care provider for individual recommendations. If you have any sort of medical condition, be sure to consult your health care provider before starting a new exercise program. What are some exercise ideas? Some moderate-intensity exercise ideas include:  Walking at a rate of 1 mile in 15 minutes.  Biking.  Hiking.  Golfing.  Dancing.   Some vigorous-intensity exercise ideas include:  Walking at a rate of at least 4.5 miles per hour.  Jogging or running at a rate of 5 miles per hour.  Biking at a rate of at least 10 miles per hour.  Lap swimming.  Roller-skating or in-line skating.  Cross-country skiing.  Vigorous competitive sports, such as football, basketball, and soccer.  Jumping rope.  Aerobic dancing.   What are some everyday activities that can help me to get exercise?  [place] work, such as: ? Pushing a Conservation officer, nature. ? Raking and bagging leaves.  Washing and waxing your car.  Pushing a stroller.  Shoveling snow.  Gardening.  Washing windows or floors. How can I be more active in my day-to-day activities?  Use the stairs instead of the elevator.  Take a walk during your lunch break.  If you drive, park your car farther away from work or school.  If you take public transportation, get off one stop early and walk the rest of the  way.  Make all of your phone calls while standing up and walking around.  Get up, stretch, and walk around every 30 minutes throughout the day. What guidelines should I follow while exercising?  Do not exercise so much that you hurt yourself, feel dizzy, or get very short of breath.  Consult your health care provider before starting a new exercise program.  Wear comfortable clothes and shoes with good support.  Drink plenty of water while you exercise to prevent dehydration or heat stroke. Body water is lost during exercise and must be replaced.  Work out until you breathe faster and your heart beats faster. This information is not intended to replace advice given to you by your health care provider. Make sure you discuss any questions you have with your health care provider.

## 2018-05-15 NOTE — Addendum Note (Signed)
Addended by: Caren Macadam on: 05/15/2018 09:56 AM   Modules accepted: Orders

## 2018-05-15 NOTE — Progress Notes (Addendum)
Robyn Medina DOB: March 14, 1966 Encounter date: 05/15/2018  This is a 53 y.o. female who presents to establish care. Chief Complaint  Patient presents with  . Annual Exam    History of present illness:  GERD: Started to see nutritionist a few weeks ago; eating much healthier and this has helped. Hasn't taken medication (prilosec) since this time. They got motivated to work on health as family and they are doing well with this.   L4-L5 herniation:Following with Dr. Paulla Fore - home exercise program, gabapentin, medrol dose pack (08/2016) with improvement in pain/sciatica sx. Back is doing a whole lot better at this point. Really doesn't bother her at all at this point. No longer has paresthesia.   Hyperlipidemia: Hasn't taken the lipitor in 3 weeks time. Stopped because thought she might not need it with all the healthier eating she was doing.   Does see obgyn. Sees Dyanna clark.    Past Medical History:  Diagnosis Date  . ALLERGIC RHINITIS 05/18/2007  . PARESTHESIA 03/03/2007   left arm numbness- resolved unclear cause  . Sinusitis 09/24/2010   Past Surgical History:  Procedure Laterality Date  . none     No Known Allergies Current Meds  Medication Sig  . Norgestimate-Ethinyl Estradiol Triphasic 0.18/0.215/0.25 MG-35 MCG tablet Take 1 tablet by mouth daily.    Social History   Tobacco Use  . Smoking status: Never Smoker  . Smokeless tobacco: Never Used  Substance Use Topics  . Alcohol use: Yes    Alcohol/week: 1.0 standard drinks    Types: 1 Glasses of wine per week   Family History  Problem Relation Age of Onset  . CAD Father 3       former smoker- stents  . Heart disease Paternal Uncle        several  . CAD Cousin        died 79 years old     Review of Systems  Constitutional: Positive for fatigue (does always feel tired; easily drops through day). Negative for activity change, appetite change, chills, fever and unexpected weight change.  HENT: Negative for  congestion, ear pain, hearing loss, sinus pressure, sinus pain, sore throat and trouble swallowing.   Eyes: Negative for pain and visual disturbance.  Respiratory: Negative for cough, chest tightness, shortness of breath and wheezing.   Cardiovascular: Negative for chest pain, palpitations and leg swelling.  Gastrointestinal: Negative for abdominal pain, blood in stool, constipation, diarrhea, nausea and vomiting.  Genitourinary: Negative for difficulty urinating and menstrual problem.  Musculoskeletal: Negative for arthralgias and back pain.  Skin: Negative for rash.  Neurological: Negative for dizziness, weakness, numbness and headaches.  Hematological: Negative for adenopathy. Does not bruise/bleed easily.  Psychiatric/Behavioral: Negative for sleep disturbance and suicidal ideas. The patient is not nervous/anxious.     Objective:  BP 110/80 (BP Location: Left Arm, Patient Position: Sitting, Cuff Size: Normal)   Pulse 79   Temp 97.8 F (36.6 C) (Oral)   Ht 5\' 4"  (1.626 m)   Wt 133 lb (60.3 kg)   LMP 04/21/2018 (Exact Date)   SpO2 97%   BMI 22.83 kg/m   Weight: 133 lb (60.3 kg)   BP Readings from Last 3 Encounters:  05/15/18 110/80  03/31/17 124/82  08/30/16 110/80   Wt Readings from Last 3 Encounters:  05/15/18 133 lb (60.3 kg)  03/31/17 143 lb 9.6 oz (65.1 kg)  08/30/16 143 lb (64.9 kg)    Physical Exam Constitutional:      General: She  is not in acute distress.    Appearance: She is well-developed.  HENT:     Head: Normocephalic and atraumatic.     Right Ear: External ear normal.     Left Ear: External ear normal.     Mouth/Throat:     Pharynx: No oropharyngeal exudate.  Eyes:     Conjunctiva/sclera: Conjunctivae normal.     Pupils: Pupils are equal, round, and reactive to light.  Neck:     Musculoskeletal: Normal range of motion and neck supple.     Thyroid: No thyromegaly.  Cardiovascular:     Rate and Rhythm: Normal rate and regular rhythm.     Heart  sounds: Normal heart sounds. No murmur. No friction rub. No gallop.   Pulmonary:     Effort: Pulmonary effort is normal.     Breath sounds: Normal breath sounds.  Abdominal:     General: Bowel sounds are normal. There is no distension.     Palpations: Abdomen is soft. There is no mass.     Tenderness: There is no abdominal tenderness. There is no guarding.     Hernia: No hernia is present.  Musculoskeletal: Normal range of motion.        General: No tenderness or deformity.  Lymphadenopathy:     Cervical: No cervical adenopathy.  Skin:    General: Skin is warm and dry.     Findings: No rash.     Comments: No abnormal moles, skin lesions noted.  Neurological:     Mental Status: She is alert and oriented to person, place, and time.     Deep Tendon Reflexes: Reflexes normal.     Reflex Scores:      Tricep reflexes are 2+ on the right side and 2+ on the left side.      Bicep reflexes are 2+ on the right side and 2+ on the left side.      Brachioradialis reflexes are 2+ on the right side and 2+ on the left side.      Patellar reflexes are 2+ on the right side and 2+ on the left side. Psychiatric:        Speech: Speech normal.        Behavior: Behavior normal.        Thought Content: Thought content normal.     Assessment/Plan:  1. Preventative health care She states she is up to date with mammogram. I have asked her to have obgyn forward [place] note after next visit.   She has agreed to colonoscopy for colon cancer screening. I have placed referral.  2. Gastroesophageal reflux disease without esophagitis Symptoms have resolved since eating healthier and cutting back on higher grease/fatty diet. Keep up great work.   3. Colon cancer screening - Ambulatory referral to Gastroenterology  4. Fatigue - sleeping well; rested in morning; energy fades through day. Will start with basic bloodwork and consider further evaluation pending results.   5. Hyperlipidemia hx: previously on  lipitor; stopped 3 weeks ago. Further recommendation pending labs today.  Return pending bloodwork; likely 1 year for next physical.  Micheline Rough, MD   Has bloodwork order through work which includes CBC, CMP, TSH, Lipid panel. She will complete this through [facility].

## 2018-05-16 LAB — HEPATIC FUNCTION PANEL
ALT: 13 (ref 7–35)
AST: 15 (ref 13–35)
Alkaline Phosphatase: 68 (ref 25–125)
Bilirubin, Total: 0.4

## 2018-05-16 LAB — CBC WITH DIFFERENTIAL/PLATELET
Basophils Absolute: 0.1 10*3/uL (ref 0.0–0.2)
Basos: 1 %
EOS (ABSOLUTE): 0.2 10*3/uL (ref 0.0–0.4)
Eos: 3 %
Hematocrit: 43.9 % (ref 34.0–46.6)
Hemoglobin: 14.5 g/dL (ref 11.1–15.9)
Immature Grans (Abs): 0 10*3/uL (ref 0.0–0.1)
Immature Granulocytes: 0 %
Lymphocytes Absolute: 2.2 10*3/uL (ref 0.7–3.1)
Lymphs: 37 %
MCH: 30.1 pg (ref 26.6–33.0)
MCHC: 33 g/dL (ref 31.5–35.7)
MCV: 91 fL (ref 79–97)
Monocytes Absolute: 0.5 10*3/uL (ref 0.1–0.9)
Monocytes: 8 %
Neutrophils Absolute: 3.1 10*3/uL (ref 1.4–7.0)
Neutrophils: 51 %
Platelets: 272 10*3/uL (ref 150–450)
RBC: 4.81 x10E6/uL (ref 3.77–5.28)
RDW: 11.8 % (ref 11.7–15.4)
WBC: 6 10*3/uL (ref 3.4–10.8)

## 2018-05-16 LAB — LIPID PANEL
Chol/HDL Ratio: 3.8 ratio (ref 0.0–4.4)
Cholesterol, Total: 184 mg/dL (ref 100–199)
Cholesterol: 184 (ref 0–200)
HDL: 49 (ref 35–70)
HDL: 49 mg/dL (ref >39)
LDL Calculated: 110 mg/dL — ABNORMAL HIGH (ref 0–99)
LDL Cholesterol: 110
Triglycerides: 126 (ref 40–160)
Triglycerides: 126 mg/dL (ref 0–149)
VLDL Cholesterol Cal: 25 mg/dL (ref 5–40)

## 2018-05-16 LAB — COMPREHENSIVE METABOLIC PANEL
ALT: 13 IU/L (ref 0–32)
AST: 15 IU/L (ref 0–40)
Albumin/Globulin Ratio: 1.4 (ref 1.2–2.2)
Albumin: 4.2 g/dL (ref 3.8–4.8)
Alkaline Phosphatase: 68 IU/L (ref 39–117)
BUN/Creatinine Ratio: 15 (ref 9–23)
BUN: 14 mg/dL (ref 6–24)
Bilirubin Total: 0.4 mg/dL (ref 0.0–1.2)
CO2: 20 mmol/L (ref 20–29)
Calcium: 9.4 mg/dL (ref 8.7–10.2)
Chloride: 103 mmol/L (ref 96–106)
Creatinine, Ser: 0.92 mg/dL (ref 0.57–1.00)
GFR calc Af Amer: 84 mL/min/{1.73_m2} (ref >59)
GFR calc non Af Amer: 73 mL/min/{1.73_m2} (ref >59)
Globulin, Total: 2.9 g/dL (ref 1.5–4.5)
Glucose: 77 mg/dL (ref 65–99)
Potassium: 4.9 mmol/L (ref 3.5–5.2)
Sodium: 139 mmol/L (ref 134–144)
Total Protein: 7.1 g/dL (ref 6.0–8.5)

## 2018-05-16 LAB — TSH: TSH: 1.36 u[IU]/mL (ref 0.450–4.500)

## 2018-05-16 LAB — CBC AND DIFFERENTIAL
HCT: 44 (ref 36–46)
Hemoglobin: 14.5 (ref 12.0–16.0)
Platelets: 12 — AB (ref 150–399)
WBC: 6

## 2018-05-16 LAB — BASIC METABOLIC PANEL
BUN: 14 (ref 4–21)
Creatinine: 0.9 (ref 0.5–1.1)
Glucose: 77
Potassium: 4.9 (ref 3.4–5.3)
Sodium: 139 (ref 137–147)

## 2018-05-18 ENCOUNTER — Encounter: Payer: Self-pay | Admitting: Gastroenterology

## 2018-05-19 ENCOUNTER — Other Ambulatory Visit: Payer: Self-pay | Admitting: Family Medicine

## 2018-05-19 DIAGNOSIS — Z1231 Encounter for screening mammogram for malignant neoplasm of breast: Secondary | ICD-10-CM

## 2018-05-24 ENCOUNTER — Ambulatory Visit (AMBULATORY_SURGERY_CENTER): Payer: Self-pay | Admitting: *Deleted

## 2018-05-24 VITALS — Ht 64.0 in | Wt 131.0 lb

## 2018-05-24 DIAGNOSIS — Z1211 Encounter for screening for malignant neoplasm of colon: Secondary | ICD-10-CM

## 2018-05-24 MED ORDER — NA SULFATE-K SULFATE-MG SULF 17.5-3.13-1.6 GM/177ML PO SOLN: ORAL | 0 refills | Status: DC

## 2018-05-24 NOTE — Progress Notes (Signed)
Patient denies any allergies to eggs or soy. Patient denies any past surgeries. Patient denies any oxygen use at home. Patient denies taking any diet/weight loss medications or blood thinners. EMMI education offered, pt declined. suprep coupon printed and given to pt.

## 2018-05-26 ENCOUNTER — Encounter: Payer: Self-pay | Admitting: Family Medicine

## 2018-06-07 ENCOUNTER — Encounter: Payer: BLUE CROSS/BLUE SHIELD | Admitting: Gastroenterology

## 2018-06-07 ENCOUNTER — Encounter: Payer: Self-pay | Admitting: Gastroenterology

## 2018-06-15 ENCOUNTER — Ambulatory Visit
Admission: RE | Admit: 2018-06-15 | Discharge: 2018-06-15 | Disposition: A | Discharge status: Home / Self Care | Payer: BLUE CROSS/BLUE SHIELD | Source: Ambulatory Visit | Attending: Family Medicine | Admitting: Family Medicine

## 2018-06-15 DIAGNOSIS — Z1231 Encounter for screening mammogram for malignant neoplasm of breast: Secondary | ICD-10-CM

## 2018-06-21 ENCOUNTER — Encounter: Payer: Self-pay | Admitting: Gastroenterology

## 2018-06-21 ENCOUNTER — Ambulatory Visit (AMBULATORY_SURGERY_CENTER): Payer: BLUE CROSS/BLUE SHIELD | Admitting: Gastroenterology

## 2018-06-21 VITALS — BP 112/72 | HR 72 | Temp 98.0°F | Resp 20 | Ht 64.0 in | Wt 133.0 lb

## 2018-06-21 DIAGNOSIS — K635 Polyp of colon: Secondary | ICD-10-CM

## 2018-06-21 DIAGNOSIS — D124 Benign neoplasm of descending colon: Secondary | ICD-10-CM

## 2018-06-21 DIAGNOSIS — Z1211 Encounter for screening for malignant neoplasm of colon: Secondary | ICD-10-CM | POA: Diagnosis not present

## 2018-06-21 MED ORDER — SODIUM CHLORIDE 0.9 % IV SOLN: 500.0000 mL | Freq: Once | INTRAVENOUS | Status: DC

## 2018-06-21 NOTE — Progress Notes (Signed)
Pt's states no medical or surgical changes since previsit or office visit. 

## 2018-06-21 NOTE — Patient Instructions (Signed)
Information on polyps, diverticulosis, and hemorrhoids given to you today.  Await pathology results.  YOU HAD AN ENDOSCOPIC PROCEDURE TODAY AT [facility]:   Refer to the procedure report that was given to you for any specific questions about what was found during the examination.  If the procedure report does not answer your questions, please call your gastroenterologist to clarify.  If you requested that your care partner not be given the details of your procedure findings, then the procedure report has been included in a sealed envelope for you to review at your convenience later.  YOU SHOULD EXPECT: Some feelings of bloating in the abdomen. Passage of more gas than usual.  Walking can help get rid of the air that was put into your GI tract during the procedure and reduce the bloating. If you had a lower endoscopy (such as a colonoscopy or flexible sigmoidoscopy) you may notice spotting of blood in your stool or on the toilet paper. If you underwent a bowel prep for your procedure, you may not have a normal bowel movement for a few days.  Please Note:  You might notice some irritation and congestion in your nose or some drainage.  This is from the oxygen used during your procedure.  There is no need for concern and it should clear up in a day or so.  SYMPTOMS TO REPORT IMMEDIATELY:   Following lower endoscopy (colonoscopy or flexible sigmoidoscopy):  Excessive amounts of blood in the stool  Significant tenderness or worsening of abdominal pains  Swelling of the abdomen that is new, acute  Fever of 100F or higher   For urgent or emergent issues, a gastroenterologist can be reached at any hour by calling [phone].   DIET:  We do recommend a small meal at first, but then you may proceed to your regular diet.  Drink plenty of fluids but you should avoid alcoholic beverages for 24 hours.  ACTIVITY:  You should plan to take it easy for the rest of today and you should  NOT DRIVE or use heavy machinery until tomorrow (because of the sedation medicines used during the test).    FOLLOW UP: Our staff will call the number listed on your records the next business day following your procedure to check on you and address any questions or concerns that you may have regarding the information given to you following your procedure. If we do not reach you, we will leave a message.  However, if you are feeling well and you are not experiencing any problems, there is no need to return our call.  We will assume that you have returned to your regular daily activities without incident.  If any biopsies were taken you will be contacted by phone or by letter within the next 1-3 weeks.  Please call us at [phone] if you have not heard about the biopsies in 3 weeks.    SIGNATURES/CONFIDENTIALITY: You and/or your care partner have signed paperwork which will be entered into your electronic medical record.  These signatures attest to the fact that that the information above on your After Visit Summary has been reviewed and is understood.  Full responsibility of the confidentiality of this discharge information lies with you and/or your care-partner.

## 2018-06-21 NOTE — Progress Notes (Signed)
Report to PACU, RN, vss, BBS= Clear.  

## 2018-06-21 NOTE — Progress Notes (Signed)
Called to room to assist during endoscopic procedure.  Patient ID and intended procedure confirmed with present staff. Received instructions for my participation in the procedure from the performing physician.  

## 2018-06-21 NOTE — Op Note (Signed)
[place] Patient Name: Robyn Medina Procedure Date: 06/21/2018 1:17 PM MRN: [id number] Endoscopist: Mauri Pole , MD Age: 53 Referring MD:  Date of Birth: 1966/01/09 Gender: Female Account #: [id number] Procedure:                Colonoscopy Indications:              Screening for colorectal malignant neoplasm Medicines:                Monitored Anesthesia Care Procedure:                Pre-Anesthesia Assessment:                           - Prior to the procedure, a History and Physical                            was performed, and patient medications and                            allergies were reviewed. The patient's tolerance of                            previous anesthesia was also reviewed. The risks                            and benefits of the procedure and the sedation                            options and risks were discussed with the patient.                            All questions were answered, and informed consent                            was obtained. Prior Anticoagulants: The patient has                            taken no previous anticoagulant or antiplatelet                            agents. ASA Grade Assessment: I - A normal, healthy                            patient. After reviewing the risks and benefits,                            the patient was deemed in satisfactory condition to                            undergo the procedure.                           After obtaining informed consent, the colonoscope  was passed under direct vision. Throughout the                            procedure, the patient's blood pressure, pulse, and                            oxygen saturations were monitored continuously. The                            Colonoscope was introduced through the anus and                            advanced to the the cecum, identified by                            appendiceal orifice and ileocecal  valve. The                            colonoscopy was performed without difficulty. The                            patient tolerated the procedure well. The quality                            of the bowel preparation was adequate. The                            ileocecal valve, appendiceal orifice, and rectum                            were photographed. Scope In: 1:22:22 PM Scope Out: 1:42:36 PM Scope Withdrawal Time: 0 hours 10 minutes 56 seconds  Total Procedure Duration: 0 hours 20 minutes 14 seconds  Findings:                 The perianal and digital rectal examinations were                            normal.                           A 1 mm polyp was found in the descending colon. The                            polyp was sessile. The polyp was removed with a                            cold biopsy forceps. Resection and retrieval were                            complete.                           A few small-mouthed diverticula were found in the  sigmoid colon.                           Non-bleeding internal hemorrhoids were found during                            retroflexion. The hemorrhoids were small. Complications:            No immediate complications. Estimated Blood Loss:     Estimated blood loss was minimal. Impression:               - One 1 mm polyp in the descending colon, removed                            with a cold biopsy forceps. Resected and retrieved.                           - Diverticulosis in the sigmoid colon.                           - Non-bleeding internal hemorrhoids. Recommendation:           - Patient has a contact number available for                            emergencies. The signs and symptoms of potential                            delayed complications were discussed with the                            patient. Return to normal activities tomorrow.                            Written discharge instructions were provided to  the                            patient.                           - Resume previous diet.                           - Continue present medications.                           - Await pathology results.                           - Repeat colonoscopy in 5-10 years for surveillance                            based on pathology results. Mauri Pole, MD 06/21/2018 1:47:28 PM This report has been signed electronically.

## 2018-06-22 ENCOUNTER — Encounter: Payer: Self-pay | Admitting: Family Medicine

## 2018-06-22 ENCOUNTER — Telehealth: Payer: Self-pay

## 2018-06-22 NOTE — Telephone Encounter (Signed)
  Follow up Call-  Call back number 06/21/2018  Post procedure Call Back phone  # [phone]  Permission to leave phone message Yes  Some recent data might be hidden     Patient questions:  Do you have a fever, pain , or abdominal swelling? No. Pain Score  0 *  Have you tolerated food without any problems? Yes.    Have you been able to return to your normal activities? Yes.    Do you have any questions about your discharge instructions: Diet   No. Medications  No. Follow up visit  No.  Do you have questions or concerns about your Care? No.  Actions: * If pain score is 4 or above: No action needed, pain <4.

## 2018-07-06 ENCOUNTER — Encounter: Payer: Self-pay | Admitting: Gastroenterology

## 2018-09-27 DIAGNOSIS — Z01419 Encounter for gynecological examination (general) (routine) without abnormal findings: Secondary | ICD-10-CM | POA: Diagnosis not present

## 2018-09-27 DIAGNOSIS — Z6823 Body mass index (BMI) 23.0-23.9, adult: Secondary | ICD-10-CM | POA: Diagnosis not present

## 2019-04-25 ENCOUNTER — Encounter: Payer: Self-pay | Admitting: Family Medicine

## 2019-05-16 DIAGNOSIS — Z6825 Body mass index (BMI) 25.0-25.9, adult: Secondary | ICD-10-CM | POA: Diagnosis not present

## 2019-05-16 DIAGNOSIS — N939 Abnormal uterine and vaginal bleeding, unspecified: Secondary | ICD-10-CM | POA: Diagnosis not present

## 2019-05-25 ENCOUNTER — Encounter: Payer: Self-pay | Admitting: Family Medicine

## 2019-05-25 ENCOUNTER — Other Ambulatory Visit: Payer: Self-pay

## 2019-05-25 ENCOUNTER — Ambulatory Visit (INDEPENDENT_AMBULATORY_CARE_PROVIDER_SITE_OTHER): Payer: BC Managed Care – PPO | Admitting: Family Medicine

## 2019-05-25 VITALS — BP 122/68 | HR 76 | Temp 97.6°F | Ht 64.0 in | Wt 146.3 lb

## 2019-05-25 DIAGNOSIS — N924 Excessive bleeding in the premenopausal period: Secondary | ICD-10-CM

## 2019-05-25 DIAGNOSIS — E7800 Pure hypercholesterolemia, unspecified: Secondary | ICD-10-CM | POA: Diagnosis not present

## 2019-05-25 DIAGNOSIS — Z0000 Encounter for general adult medical examination without abnormal findings: Secondary | ICD-10-CM | POA: Diagnosis not present

## 2019-05-25 LAB — CBC WITH DIFFERENTIAL/PLATELET
Basophils Absolute: 0.1 10*3/uL (ref 0.0–0.1)
Basophils Relative: 0.9 % (ref 0.0–3.0)
Eosinophils Absolute: 0.3 10*3/uL (ref 0.0–0.7)
Eosinophils Relative: 4.3 % (ref 0.0–5.0)
HCT: 39.8 % (ref 36.0–46.0)
Hemoglobin: 13.8 g/dL (ref 12.0–15.0)
Lymphocytes Relative: 33.7 % (ref 12.0–46.0)
Lymphs Abs: 2 10*3/uL (ref 0.7–4.0)
MCHC: 34.7 g/dL (ref 30.0–36.0)
MCV: 88.5 fl (ref 78.0–100.0)
Monocytes Absolute: 0.4 10*3/uL (ref 0.1–1.0)
Monocytes Relative: 7.1 % (ref 3.0–12.0)
Neutro Abs: 3.2 10*3/uL (ref 1.4–7.7)
Neutrophils Relative %: 54 % (ref 43.0–77.0)
Platelets: 259 10*3/uL (ref 150.0–400.0)
RBC: 4.5 Mil/uL (ref 3.87–5.11)
RDW: 12.4 % (ref 11.5–15.5)
WBC: 5.9 10*3/uL (ref 4.0–10.5)

## 2019-05-25 LAB — LIPID PANEL
Cholesterol: 198 mg/dL (ref 0–200)
HDL: 46.1 mg/dL (ref >39.00)
LDL Cholesterol: 125 mg/dL — ABNORMAL HIGH (ref 0–99)
NonHDL: 152.03
Total CHOL/HDL Ratio: 4
Triglycerides: 134 mg/dL (ref 0.0–149.0)
VLDL: 26.8 mg/dL (ref 0.0–40.0)

## 2019-05-25 LAB — COMPREHENSIVE METABOLIC PANEL
ALT: 18 U/L (ref 0–35)
AST: 15 U/L (ref 0–37)
Albumin: 4.2 g/dL (ref 3.5–5.2)
Alkaline Phosphatase: 57 U/L (ref 39–117)
BUN: 10 mg/dL (ref 6–23)
CO2: 25 mEq/L (ref 19–32)
Calcium: 9.2 mg/dL (ref 8.4–10.5)
Chloride: 105 mEq/L (ref 96–112)
Creatinine, Ser: 0.85 mg/dL (ref 0.40–1.20)
GFR: 70.27 mL/min (ref >60.00)
Glucose, Bld: 90 mg/dL (ref 70–99)
Potassium: 4.6 mEq/L (ref 3.5–5.1)
Sodium: 136 mEq/L (ref 135–145)
Total Bilirubin: 0.5 mg/dL (ref 0.2–1.2)
Total Protein: 7.1 g/dL (ref 6.0–8.3)

## 2019-05-25 LAB — IBC + FERRITIN
Ferritin: 62.9 ng/mL (ref 10.0–291.0)
Iron: 100 ug/dL (ref 42–145)
Saturation Ratios: 26.4 % (ref 20.0–50.0)
Transferrin: 271 mg/dL (ref 212.0–360.0)

## 2019-05-25 LAB — TSH: TSH: 1.32 u[IU]/mL (ref 0.35–4.50)

## 2019-05-25 NOTE — Progress Notes (Signed)
Robyn Medina DOB: 1965-09-01 Encounter date: 05/25/2019  This is a 54 y.o. female who presents for complete physical   History of present illness/Additional concerns:  Last visit with me was 1 year ago for a physical.  At that time she had worked on lifestyle changes and had improved overall health, even stopping cholesterol medicine.  Last colonoscopy was in February/2020.  Repeat recommended in 10 years.  Following with gynecology, Jerelyn Charles. Just had period this past week, but previous period never stopped - 4 weeks of menses. Put on sprintec taper down which stopped the bleeding and then once she completes this she starts back on regular birth control.  Last mammogram was February/2020 and was normal. Will schedule this.   Saw dermatology in past for rash on arms/legs. Used creams to heal up rash which worked. Had new rash come up on chest that was itchy and put anti-itch spray on chest and it burned but stopped itching.   Past Medical History:  Diagnosis Date  . ALLERGIC RHINITIS 05/18/2007  . PARESTHESIA 03/03/2007   left arm numbness- resolved unclear cause  . Sinusitis 09/24/2010   Past Surgical History:  Procedure Laterality Date  . none     No Known Allergies Current Meds  Medication Sig  . norgestimate-ethinyl estradiol (SPRINTEC 28) 0.25-35 MG-MCG tablet Sprintec (28) 0.25 mg-35 mcg tablet  Take 3 tabs PO daily x 3d, 2 tabs PO daily x 3d, then 1 tab PO daily x 15 d (skip placebo pills)  . Norgestimate-Ethinyl Estradiol Triphasic (TRI-LO-MARZIA) 0.18/0.215/0.25 MG-25 MCG tab Tri-Lo-Marzia 0.18 mg/0.215 mg/0.25 mg-25 mcg tablet  TAKE 1 TABLET BY MOUTH EVERY DAY  . Norgestimate-Ethinyl Estradiol Triphasic 0.18/0.215/0.25 MG-35 MCG tablet Take 1 tablet by mouth daily.   . Pediatric Multivit-Minerals-C (SMARTY PANTS KIDS COMPLETE PO) Take by mouth.   Social History   Tobacco Use  . Smoking status: Never Smoker  . Smokeless tobacco: Never Used  Substance Use Topics  .  Alcohol use: Yes    Alcohol/week: 1.0 standard drinks    Types: 1 Glasses of wine per week   Family History  Problem Relation Age of Onset  . CAD Father 89       former smoker- stents  . Heart disease Paternal Uncle        several  . CAD Cousin        died 48 years old  . Colon cancer Neg Hx   . Colon polyps Neg Hx   . Esophageal cancer Neg Hx   . Rectal cancer Neg Hx   . Stomach cancer Neg Hx      Review of Systems  Constitutional: Negative for activity change, appetite change, chills, fatigue, fever and unexpected weight change.  HENT: Negative for congestion, ear pain, hearing loss, sinus pressure, sinus pain, sore throat and trouble swallowing.   Eyes: Negative for pain and visual disturbance.  Respiratory: Negative for cough, chest tightness, shortness of breath and wheezing.   Cardiovascular: Negative for chest pain, palpitations and leg swelling.  Gastrointestinal: Negative for abdominal pain, blood in stool, constipation, diarrhea, nausea and vomiting.  Genitourinary: Negative for difficulty urinating and menstrual problem.  Musculoskeletal: Negative for arthralgias and back pain.  Skin: Negative for rash.  Neurological: Negative for dizziness, weakness, numbness and headaches.  Hematological: Negative for adenopathy. Does not bruise/bleed easily.  Psychiatric/Behavioral: Negative for sleep disturbance and suicidal ideas. The patient is not nervous/anxious.     CBC:  Lab Results  Component Value Date  WBC 6.0 05/16/2018   WBC 6.2 04/13/2017   HGB 14.5 05/16/2018   HGB 14.5 05/15/2018   HCT 44 05/16/2018   HCT 43.9 05/15/2018   MCH 30.1 05/15/2018   MCH 31.3 02/25/2013   MCHC 33.0 05/15/2018   MCHC 33.4 04/13/2017   RDW 11.8 05/15/2018   PLT 12 (A) 05/16/2018   PLT 272 05/15/2018   CMP: Lab Results  Component Value Date   NA 139 05/16/2018   K 4.9 05/16/2018   CL 103 05/15/2018   CO2 20 05/15/2018   GLUCOSE 77 05/15/2018   GLUCOSE 92 04/13/2017    BUN 14 05/16/2018   CREATININE 0.9 05/16/2018   CREATININE 0.92 05/15/2018   LABGLOB 2.9 05/15/2018   GFRAA 84 05/15/2018   CALCIUM 9.4 05/15/2018   PROT 7.1 05/15/2018   AGRATIO 1.4 05/15/2018   BILITOT 0.4 05/15/2018   ALKPHOS 68 05/16/2018   ALT 13 05/16/2018   AST 15 05/16/2018   LIPID: Lab Results  Component Value Date   CHOL 184 05/16/2018   CHOL 184 05/15/2018   TRIG 126 05/16/2018   HDL 49 05/16/2018   HDL 49 05/15/2018   LDLCALC 110 05/16/2018   LDLCALC 110 (H) 05/15/2018   LABVLDL 25 05/15/2018    Objective:  BP 122/68 (BP Location: Right Arm, Patient Position: Sitting, Cuff Size: Normal)   Pulse 76   Temp 97.6 F (36.4 C) (Temporal)   Ht 5\' 4"  (1.626 m)   Wt 146 lb 4.8 oz (66.4 kg)   SpO2 97%   BMI 25.11 kg/m   Weight: 146 lb 4.8 oz (66.4 kg)   BP Readings from Last 3 Encounters:  05/25/19 122/68  06/21/18 112/72  05/15/18 110/80   Wt Readings from Last 3 Encounters:  05/25/19 146 lb 4.8 oz (66.4 kg)  06/21/18 133 lb (60.3 kg)  05/24/18 131 lb (59.4 kg)    Physical Exam Constitutional:      General: She is not in acute distress.    Appearance: She is well-developed.  HENT:     Head: Normocephalic and atraumatic.     Right Ear: External ear normal.     Left Ear: External ear normal.     Mouth/Throat:     Pharynx: No oropharyngeal exudate.  Eyes:     Conjunctiva/sclera: Conjunctivae normal.     Pupils: Pupils are equal, round, and reactive to light.  Neck:     Thyroid: No thyromegaly.  Cardiovascular:     Rate and Rhythm: Normal rate and regular rhythm.     Heart sounds: Normal heart sounds. No murmur. No friction rub. No gallop.   Pulmonary:     Effort: Pulmonary effort is normal.     Breath sounds: Normal breath sounds.  Abdominal:     General: Bowel sounds are normal. There is no distension.     Palpations: Abdomen is soft. There is no mass.     Tenderness: There is no abdominal tenderness. There is no guarding.     Hernia: No  hernia is present.  Musculoskeletal:        General: No tenderness or deformity. Normal range of motion.     Cervical back: Normal range of motion and neck supple.  Lymphadenopathy:     Cervical: No cervical adenopathy.  Skin:    General: Skin is warm and dry.     Findings: No rash.  Neurological:     Mental Status: She is alert and oriented to person, place, and time.  Deep Tendon Reflexes: Reflexes normal.     Reflex Scores:      Tricep reflexes are 2+ on the right side and 2+ on the left side.      Bicep reflexes are 2+ on the right side and 2+ on the left side.      Brachioradialis reflexes are 2+ on the right side and 2+ on the left side.      Patellar reflexes are 2+ on the right side and 2+ on the left side. Psychiatric:        Speech: Speech normal.        Behavior: Behavior normal.        Thought Content: Thought content normal.     Assessment/Plan: There are no preventive care reminders to display for this patient. Health Maintenance reviewed - she will schedule mammogram.  1. Preventative health care She is getting back on track with healthy eating and is going to work on getting back on regular walking regimen. She will schedule mammogram.  2. Excessive bleeding in premenopausal period Following with gyn; will get some baseline bloodwork today. - CBC with Differential/Platelet; Future - IBC + Ferritin; Future - TSH; Future  3. Pure hypercholesterolemia Off of statin due to healthy lifestyle changes.  - Comprehensive metabolic panel; Future - Lipid panel; Future - TSH; Future    Return in about 1 year (around 05/24/2020) for physical exam.  Micheline Rough, MD

## 2019-05-25 NOTE — Addendum Note (Signed)
Addended by: Suzette Battiest on: 05/25/2019 08:31 AM   Modules accepted: Orders

## 2019-05-25 NOTE — Patient Instructions (Signed)
It was nice to see you today! I'll call you with lab results once I see them.

## 2019-06-19 ENCOUNTER — Encounter: Payer: Self-pay | Admitting: Family Medicine

## 2019-06-22 ENCOUNTER — Other Ambulatory Visit: Payer: Self-pay

## 2019-06-22 ENCOUNTER — Ambulatory Visit
Admission: EM | Admit: 2019-06-22 | Discharge: 2019-06-22 | Disposition: A | Discharge status: Home / Self Care | Payer: BC Managed Care – PPO | Attending: Emergency Medicine | Admitting: Emergency Medicine

## 2019-06-22 DIAGNOSIS — Z20822 Contact with and (suspected) exposure to covid-19: Secondary | ICD-10-CM

## 2019-06-22 LAB — POC SARS CORONAVIRUS 2 AG -  ED: SARS Coronavirus 2 Ag: NEGATIVE

## 2019-06-22 MED ORDER — PREDNISONE 10 MG PO TABS: 20.0000 mg | ORAL_TABLET | Freq: Every day | ORAL | 0 refills | Status: DC

## 2019-06-22 MED ORDER — BENZONATATE 100 MG PO CAPS: 100.0000 mg | ORAL_CAPSULE | Freq: Three times a day (TID) | ORAL | 0 refills | Status: DC

## 2019-06-22 MED ORDER — FLUTICASONE PROPIONATE 50 MCG/ACT NA SUSP: 1.0000 | Freq: Every day | NASAL | 0 refills | Status: DC

## 2019-06-22 NOTE — ED Provider Notes (Signed)
[facility]    CSN: [id number] Arrival date & time: 06/22/19  1816      History   Chief Complaint Chief Complaint  Patient presents with  . Cough  . Headache    HPI Robyn Medina is a 54 y.o. female.   Who present to the urgent care for complaint of cough and headache for the past 3 days.  Reported positive Covid exposure.  Denies sick exposure to, flu or strep.  Denies recent travel.  Denies aggravating or alleviating symptoms.  Denies previous COVID infection.   Denies fever, chills, fatigue, nasal congestion, rhinorrhea, sore throat, SOB, wheezing, chest pain, nausea, vomiting, changes in bowel or bladder habits.    The history is provided by the patient. No language interpreter was used.  Cough Associated symptoms: headaches   Headache Associated symptoms: cough     Past Medical History:  Diagnosis Date  . ALLERGIC RHINITIS 05/18/2007  . PARESTHESIA 03/03/2007   left arm numbness- resolved unclear cause  . Sinusitis 09/24/2010    Patient Active Problem List   Diagnosis Date Noted  . Herniation of right side of L4-L5 intervertebral disc 07/19/2016  . Abnormal x-ray 07/12/2016  . Acute bilateral low back pain with right-sided sciatica 07/12/2016  . GERD (gastroesophageal reflux disease) 06/18/2015  . Pure hypercholesterolemia 07/05/2013  . Allergic rhinitis 05/18/2007    Past Surgical History:  Procedure Laterality Date  . none      OB History   No obstetric history on file.      Home Medications    Prior to Admission medications   Medication Sig Start Date End Date Taking? Authorizing Provider  benzonatate (TESSALON) 100 MG capsule Take 1 capsule (100 mg total) by mouth every 8 (eight) hours. 06/22/19   Kishaun Erekson, Darrelyn Hillock, FNP  fluticasone (FLONASE) 50 MCG/ACT nasal spray Place 1 spray into both nostrils daily for 14 days. 06/22/19 07/06/19  Rashema Seawright, Darrelyn Hillock, FNP  norgestimate-ethinyl estradiol (SPRINTEC 28) 0.25-35 MG-MCG tablet Sprintec (28)  0.25 mg-35 mcg tablet  Take 3 tabs PO daily x 3d, 2 tabs PO daily x 3d, then 1 tab PO daily x 15 d (skip placebo pills)    [provider]  Norgestimate-Ethinyl Estradiol Triphasic (TRI-LO-MARZIA) 0.18/0.215/0.25 MG-25 MCG tab Tri-Lo-Marzia 0.18 mg/0.215 mg/0.25 mg-25 mcg tablet  TAKE 1 TABLET BY MOUTH EVERY DAY    [provider]  Norgestimate-Ethinyl Estradiol Triphasic 0.18/0.215/0.25 MG-35 MCG tablet Take 1 tablet by mouth daily.  06/24/12   [provider]  Pediatric Multivit-Minerals-C (SMARTY PANTS KIDS COMPLETE PO) Take by mouth.    [provider]  predniSONE (DELTASONE) 10 MG tablet Take 2 tablets (20 mg total) by mouth daily. 06/22/19   Emerson Monte, FNP    Family History Family History  Problem Relation Age of Onset  . CAD Father 74       former smoker- stents  . Heart disease Paternal Uncle        several  . CAD Cousin        died 60 years old  . Colon cancer Neg Hx   . Colon polyps Neg Hx   . Esophageal cancer Neg Hx   . Rectal cancer Neg Hx   . Stomach cancer Neg Hx     Social History Social History   Tobacco Use  . Smoking status: Never Smoker  . Smokeless tobacco: Never Used  Substance Use Topics  . Alcohol use: Yes    Alcohol/week: 1.0 standard drinks  Types: 1 Glasses of wine per week  . Drug use: No     Allergies   Patient has no known allergies.   Review of Systems Review of Systems  Constitutional: Negative.   HENT: Negative.   Respiratory: Positive for cough.   Cardiovascular: Negative.   Gastrointestinal: Negative.   Neurological: Positive for headaches.  All other systems reviewed and are negative.    Physical Exam Triage Vital Signs ED Triage Vitals  Enc Vitals Group     BP 06/22/19 1838 104/72     Pulse Rate 06/22/19 1838 83     Resp 06/22/19 1838 18     Temp 06/22/19 1838 99.1 F (37.3 C)     Temp src --      SpO2 06/22/19 1838 96 %     Weight --      Height --      Head  Circumference --      Peak Flow --      Pain Score 06/22/19 1837 3     Pain Loc --      Pain Edu? --      Excl. in [place]? --    No data found.  Updated Vital Signs BP 104/72   Pulse 83   Temp 99.1 F (37.3 C)   Resp 18   LMP 06/11/2019   SpO2 96%   Visual Acuity Right Eye Distance:   Left Eye Distance:   Bilateral Distance:    Right Eye Near:   Left Eye Near:    Bilateral Near:     Physical Exam Vitals and nursing note reviewed.  Constitutional:      General: She is not in acute distress.    Appearance: Normal appearance. She is normal weight. She is not ill-appearing or toxic-appearing.  HENT:     Head: Normocephalic.     Right Ear: Tympanic membrane, ear canal and external ear normal. There is no impacted cerumen.     Left Ear: Tympanic membrane, ear canal and external ear normal. There is no impacted cerumen.     Nose: Nose normal. No congestion.     Mouth/Throat:     Mouth: Mucous membranes are moist.     Pharynx: Oropharynx is clear. No oropharyngeal exudate or posterior oropharyngeal erythema.  Cardiovascular:     Rate and Rhythm: Normal rate and regular rhythm.     Pulses: Normal pulses.     Heart sounds: Normal heart sounds. No murmur.  Pulmonary:     Effort: Pulmonary effort is normal. No respiratory distress.     Breath sounds: Normal breath sounds. No wheezing or rhonchi.  Chest:     Chest wall: No tenderness.  Abdominal:     Palpations: There is no mass.  Neurological:     Mental Status: She is alert and oriented to person, place, and time.      UC Treatments / Results  Labs (all labs ordered are listed, but only abnormal results are displayed) Labs Reviewed  NOVEL CORONAVIRUS, NAA  POC SARS CORONAVIRUS 2 AG -  ED    EKG   Radiology No results found.  Procedures Procedures (including critical care time)  Medications Ordered in UC Medications - No data to display  Initial Impression / Assessment and Plan / UC Course  I have  reviewed the triage vital signs and the nursing notes.  Pertinent labs & imaging results that were available during my care of the patient were reviewed by me and considered in my  medical decision making (see chart for details).     Patient is stable at discharge. POCT COVID-19 test was negative.  [facility] COVID-19 test was ordered Advised patient to quarantine Flonase was prescribed Tessalon Perles prescribed Short-term prednisone was prescribed To go to ED for worsening of symptoms   Final Clinical Impressions(s) / UC Diagnoses   Final diagnoses:  Close exposure to COVID-19 virus     Discharge Instructions     You should remain isolated in your home for 10 days after symptom onset and after resolution of fever for at least 24 hours, without the use of fever-reducing medications, and with improvement of other symptoms Get plenty of rest and push fluids Use medications daily for symptom relief Use OTC medications like ibuprofen or tylenol as needed fever or pain Follow up with PCP in 1-2 days via phone or e-visit for recheck and to ensure symptoms are improving Call or go to the ED if you have any new or worsening symptoms such as fever, worsening cough, shortness of breath, chest tightness, chest pain, turning blue, changes in mental status, etc...      ED Prescriptions    Medication Sig Dispense Auth. Provider   fluticasone (FLONASE) 50 MCG/ACT nasal spray Place 1 spray into both nostrils daily for 14 days. 16 g Katura Eatherly S, FNP   benzonatate (TESSALON) 100 MG capsule Take 1 capsule (100 mg total) by mouth every 8 (eight) hours. 21 capsule Eliana Lueth S, FNP   predniSONE (DELTASONE) 10 MG tablet Take 2 tablets (20 mg total) by mouth daily. 15 tablet Ashiah Karpowicz, Darrelyn Hillock, FNP     PDMP not reviewed this encounter.   Emerson Monte, FNP 06/22/19 1910

## 2019-06-22 NOTE — Discharge Instructions (Signed)
You should remain isolated in your home for 10 days after symptom onset and after resolution of fever for at least 24 hours, without the use of fever-reducing medications, and with improvement of other symptoms Get plenty of rest and push fluids Use medications daily for symptom relief Use OTC medications like ibuprofen or tylenol as needed fever or pain Follow up with PCP in 1-2 days via phone or e-visit for recheck and to ensure symptoms are improving Call or go to the ED if you have any new or worsening symptoms such as fever, worsening cough, shortness of breath, chest tightness, chest pain, turning blue, changes in mental status, etc..Marland Kitchen

## 2019-06-22 NOTE — ED Triage Notes (Signed)
Pt husband tested positive for covid on Tuesday and began having cough and headache yesterday

## 2019-06-24 LAB — NOVEL CORONAVIRUS, NAA: SARS-CoV-2, NAA: NOT DETECTED

## 2019-06-27 ENCOUNTER — Other Ambulatory Visit: Payer: Self-pay | Admitting: Family Medicine

## 2019-06-27 DIAGNOSIS — Z1231 Encounter for screening mammogram for malignant neoplasm of breast: Secondary | ICD-10-CM

## 2019-06-30 ENCOUNTER — Other Ambulatory Visit: Payer: Self-pay

## 2019-06-30 ENCOUNTER — Ambulatory Visit
Admission: EM | Admit: 2019-06-30 | Discharge: 2019-06-30 | Disposition: A | Discharge status: Home / Self Care | Payer: BC Managed Care – PPO | Attending: Emergency Medicine | Admitting: Emergency Medicine

## 2019-06-30 DIAGNOSIS — Z20822 Contact with and (suspected) exposure to covid-19: Secondary | ICD-10-CM | POA: Diagnosis not present

## 2019-06-30 DIAGNOSIS — J0100 Acute maxillary sinusitis, unspecified: Secondary | ICD-10-CM

## 2019-06-30 MED ORDER — CETIRIZINE HCL 10 MG PO TABS: 10.0000 mg | ORAL_TABLET | Freq: Every day | ORAL | 0 refills | Status: DC

## 2019-06-30 MED ORDER — AMOXICILLIN-POT CLAVULANATE 875-125 MG PO TABS: 1.0000 | ORAL_TABLET | Freq: Two times a day (BID) | ORAL | 0 refills | Status: AC

## 2019-06-30 NOTE — ED Triage Notes (Signed)
Pt presents with worsening symptoms. Pts husband and son tested positive

## 2019-06-30 NOTE — Discharge Instructions (Signed)
COVID testing ordered.  It will take between 5-7 days for test results.  Someone will contact you regarding abnormal results.   You have already completed quarantine.  Should be safe to return to work on Wednesday Get plenty of rest and push fluids We will treat you for sinus infection.  Augmentin prescribed.  Take as directed and to completed Continue prednisone as prescribed and to completion Zyrtec prescribed as well Use OTC medications like ibuprofen or tylenol as needed fever or pain Follow up with PCP in 1-2 days via video or telephone for recheck and to ensure symptoms are improving Call or go to the ED if you have any new or worsening symptoms such as fever, worsening cough, shortness of breath, chest tightness, chest pain, turning blue, changes in mental status, etc..Marland Kitchen

## 2019-06-30 NOTE — ED Provider Notes (Signed)
[place]   [id number] 06/30/19 Arrival Time: 1001   CC: COVID symptoms  SUBJECTIVE: History from: patient.  Robyn Medina is a 54 y.o. female who presents with cough and sinus headache x 12 day, worsening sinus headache, congestion, and 3 episodes of loose stools x 1 day.  Patient's husband and son tested positive for COVID.  She had COVID test done last week and was negative.  Has tried prednisone, and flonase with minimal relief.  Symptoms are made worse at night.  Denies previous symptoms in the past.   Denies fever, chills, SOB, wheezing, chest pain, nausea, vomiting, changes in bladder habits.    ROS: As per HPI.  All other pertinent ROS negative.     Past Medical History:  Diagnosis Date  . ALLERGIC RHINITIS 05/18/2007  . PARESTHESIA 03/03/2007   left arm numbness- resolved unclear cause  . Sinusitis 09/24/2010   Past Surgical History:  Procedure Laterality Date  . none     No Known Allergies No current facility-administered medications on file prior to encounter.   Current Outpatient Medications on File Prior to Encounter  Medication Sig Dispense Refill  . benzonatate (TESSALON) 100 MG capsule Take 1 capsule (100 mg total) by mouth every 8 (eight) hours. 21 capsule 0  . fluticasone (FLONASE) 50 MCG/ACT nasal spray Place 1 spray into both nostrils daily for 14 days. 16 g 0  . norgestimate-ethinyl estradiol (SPRINTEC 28) 0.25-35 MG-MCG tablet Sprintec (28) 0.25 mg-35 mcg tablet  Take 3 tabs PO daily x 3d, 2 tabs PO daily x 3d, then 1 tab PO daily x 15 d (skip placebo pills)    . Norgestimate-Ethinyl Estradiol Triphasic (TRI-LO-MARZIA) 0.18/0.215/0.25 MG-25 MCG tab Tri-Lo-Marzia 0.18 mg/0.215 mg/0.25 mg-25 mcg tablet  TAKE 1 TABLET BY MOUTH EVERY DAY    . Norgestimate-Ethinyl Estradiol Triphasic 0.18/0.215/0.25 MG-35 MCG tablet Take 1 tablet by mouth daily.     . Pediatric Multivit-Minerals-C (SMARTY PANTS KIDS COMPLETE PO) Take by mouth.    . predniSONE (DELTASONE)  10 MG tablet Take 2 tablets (20 mg total) by mouth daily. 15 tablet 0   Social History   Socioeconomic History  . Marital status: Married    Spouse name: Not on file  . Number of children: Not on file  . Years of education: Not on file  . Highest education level: Not on file  Occupational History  . Not on file  Tobacco Use  . Smoking status: Never Smoker  . Smokeless tobacco: Never Used  Substance and Sexual Activity  . Alcohol use: Yes    Alcohol/week: 1.0 standard drinks    Types: 1 Glasses of wine per week  . Drug use: No  . Sexual activity: Not on file  Other Topics Concern  . Not on file  Social History Narrative   Married- Robyn Medina for 30 years in 2017. 2 children- Robyn Medina 68 and Robyn Medina 28.       Works at [employer]: walking, reading, eating   Social Determinants of Radio broadcast assistant Strain:   . Difficulty of Paying Living Expenses: Not on file  Food Insecurity:   . Worried About Charity fundraiser in the Last Year: Not on file  . Ran Out of Food in the Last Year: Not on file  Transportation Needs:   . Lack of Transportation (Medical): Not on file  . Lack of Transportation (Non-Medical): Not on file  Physical Activity:   . Days of  Exercise per Week: Not on file  . Minutes of Exercise per Session: Not on file  Stress:   . Feeling of Stress : Not on file  Social Connections:   . Frequency of Communication with Friends and Family: Not on file  . Frequency of Social Gatherings with Friends and Family: Not on file  . Attends Religious Services: Not on file  . Active Member of Clubs or Organizations: Not on file  . Attends Archivist Meetings: Not on file  . Marital Status: Not on file  Intimate Partner Violence:   . Fear of Current or Ex-Partner: Not on file  . Emotionally Abused: Not on file  . Physically Abused: Not on file  . Sexually Abused: Not on file   Family History  Problem Relation Age of Onset  . CAD Father  47       former smoker- stents  . Heart disease Paternal Uncle        several  . CAD Cousin        died 63 years old  . Colon cancer Neg Hx   . Colon polyps Neg Hx   . Esophageal cancer Neg Hx   . Rectal cancer Neg Hx   . Stomach cancer Neg Hx     OBJECTIVE:  Vitals:   06/30/19 1017  BP: 130/79  Pulse: 86  Resp: 20  Temp: 98.7 F (37.1 C)  SpO2: 96%     General appearance: alert; appears fatigued, but nontoxic; speaking in full sentences and tolerating own secretions HEENT: NCAT; Ears: EACs clear, TMs pearly gray; Eyes: PERRL.  EOM grossly intact. Sinuses: TTP over maxillary sinuses; Nose: nares patent without rhinorrhea, Throat: oropharynx clear, tonsils non erythematous or enlarged, uvula midline  Neck: supple without LAD Lungs: unlabored respirations, symmetrical air entry; cough: mild; no respiratory distress; CTAB Heart: regular rate and rhythm.   Skin: warm and dry Psychological: alert and cooperative; normal mood and affect  ASSESSMENT & PLAN:  1. Acute non-recurrent maxillary sinusitis   2. Suspected COVID-19 virus infection     Meds ordered this encounter  Medications  . amoxicillin-clavulanate (AUGMENTIN) 875-125 MG tablet    Sig: Take 1 tablet by mouth every 12 (twelve) hours for 10 days.    Dispense:  20 tablet    Refill:  0    Order Specific Question:   Supervising Provider    Answer:   Raylene Everts [id number]  . cetirizine (ZYRTEC) 10 MG tablet    Sig: Take 1 tablet (10 mg total) by mouth daily.    Dispense:  30 tablet    Refill:  0    Order Specific Question:   Supervising Provider    Answer:   Raylene Everts [id number]   COVID testing ordered.  It will take between 5-7 days for test results.  Someone will contact you regarding abnormal results.   You have already completed quarantine.  Should be safe to return to work on Wednesday Get plenty of rest and push fluids We will treat you for sinus infection.  Augmentin prescribed.  Take as  directed and to completed Continue prednisone as prescribed and to completion Zyrtec prescribed as well Use OTC medications like ibuprofen or tylenol as needed fever or pain Follow up with PCP in 1-2 days via video or telephone for recheck and to ensure symptoms are improving Call or go to the ED if you have any new or worsening symptoms such as fever, worsening cough, shortness of breath,  chest tightness, chest pain, turning blue, changes in mental status, etc...   Reviewed expectations re: course of current medical issues. Questions answered. Outlined signs and symptoms indicating need for more acute intervention. Patient verbalized understanding. After Visit Summary given.         Lestine Box, PA-C 06/30/19 1043

## 2019-07-01 LAB — NOVEL CORONAVIRUS, NAA: SARS-CoV-2, NAA: DETECTED — AB

## 2019-07-02 ENCOUNTER — Telehealth (HOSPITAL_COMMUNITY): Payer: Self-pay | Admitting: Emergency Medicine

## 2019-07-02 NOTE — Telephone Encounter (Signed)

## 2019-07-26 ENCOUNTER — Other Ambulatory Visit: Payer: Self-pay

## 2019-07-26 ENCOUNTER — Ambulatory Visit
Admission: RE | Admit: 2019-07-26 | Discharge: 2019-07-26 | Disposition: A | Discharge status: Home / Self Care | Payer: BC Managed Care – PPO | Source: Ambulatory Visit | Attending: Family Medicine | Admitting: Family Medicine

## 2019-07-26 DIAGNOSIS — Z1231 Encounter for screening mammogram for malignant neoplasm of breast: Secondary | ICD-10-CM

## 2019-09-04 ENCOUNTER — Telehealth: Payer: Self-pay | Admitting: *Deleted

## 2019-09-04 NOTE — Telephone Encounter (Signed)
[facility] faxed a note requesting a refill for Omeprazole.  Message sent to PCP as Rx is not on the pts current med list.

## 2019-09-05 ENCOUNTER — Other Ambulatory Visit: Payer: Self-pay | Admitting: Family Medicine

## 2019-09-05 DIAGNOSIS — E7800 Pure hypercholesterolemia, unspecified: Secondary | ICD-10-CM

## 2019-09-05 DIAGNOSIS — Z0001 Encounter for general adult medical examination with abnormal findings: Secondary | ICD-10-CM

## 2019-09-05 MED ORDER — OMEPRAZOLE 20 MG PO CPDR: DELAYED_RELEASE_CAPSULE | ORAL | 1 refills | Status: DC

## 2019-09-05 NOTE — Telephone Encounter (Signed)
refilled 

## 2019-09-05 NOTE — Telephone Encounter (Signed)
Tried to leave message but no vm set up

## 2019-11-15 ENCOUNTER — Ambulatory Visit: Payer: BC Managed Care – PPO | Attending: Internal Medicine

## 2019-11-15 DIAGNOSIS — Z23 Encounter for immunization: Secondary | ICD-10-CM

## 2019-11-15 DIAGNOSIS — Z01419 Encounter for gynecological examination (general) (routine) without abnormal findings: Secondary | ICD-10-CM | POA: Diagnosis not present

## 2019-11-15 DIAGNOSIS — Z6825 Body mass index (BMI) 25.0-25.9, adult: Secondary | ICD-10-CM | POA: Diagnosis not present

## 2019-11-15 NOTE — Progress Notes (Signed)
   Covid-19 Vaccination Clinic  Name:  RIANN OMAN    MRN: [id number] DOB: 07/22/1965  11/15/2019  Ms. Vester was observed post Covid-19 immunization for 15 minutes without incident. She was provided with Vaccine Information Sheet and instruction to access the V-Safe system.   Ms. Lapage was instructed to call 911 with any severe reactions post vaccine: Marland Kitchen Difficulty breathing  . Swelling of face and throat  . A fast heartbeat  . A bad rash all over body  . Dizziness and weakness   Immunizations Administered    Name Date Dose VIS Date Route   Pfizer COVID-19 Vaccine 11/15/2019  1:58 PM 0.3 mL 06/20/2018 Intramuscular   Manufacturer: [employer]   Lot: [id number]   [place]: [id number]

## 2019-12-06 ENCOUNTER — Ambulatory Visit: Payer: BC Managed Care – PPO | Attending: Internal Medicine

## 2019-12-06 DIAGNOSIS — Z23 Encounter for immunization: Secondary | ICD-10-CM

## 2019-12-06 NOTE — Progress Notes (Signed)
   Covid-19 Vaccination Clinic  Name:  Robyn Medina    MRN: [id number] DOB: [birth year]  12/06/2019  Robyn Medina was observed post Covid-19 immunization for 15 minutes without incident. She was provided with Vaccine Information Sheet and instruction to access the V-Safe system.   Robyn Medina was instructed to call 911 with any severe reactions post vaccine: Marland Kitchen Difficulty breathing  . Swelling of face and throat  . A fast heartbeat  . A bad rash all over body  . Dizziness and weakness   Immunizations Administered    Name Date Dose VIS Date Route   Pfizer COVID-19 Vaccine 12/06/2019  4:24 PM 0.3 mL 06/20/2018 Intramuscular   Manufacturer: Marissa   Lot: [id number]   [place]: [id number]    .

## 2020-03-13 ENCOUNTER — Other Ambulatory Visit: Payer: Self-pay | Admitting: Family Medicine

## 2020-03-13 DIAGNOSIS — E7800 Pure hypercholesterolemia, unspecified: Secondary | ICD-10-CM

## 2020-03-13 DIAGNOSIS — Z0001 Encounter for general adult medical examination with abnormal findings: Secondary | ICD-10-CM

## 2020-04-29 ENCOUNTER — Other Ambulatory Visit: Payer: Self-pay

## 2020-04-29 ENCOUNTER — Ambulatory Visit
Admission: EM | Admit: 2020-04-29 | Discharge: 2020-04-29 | Disposition: A | Discharge status: Home / Self Care | Payer: BC Managed Care – PPO | Attending: Family Medicine | Admitting: Family Medicine

## 2020-04-29 DIAGNOSIS — R059 Cough, unspecified: Secondary | ICD-10-CM | POA: Diagnosis not present

## 2020-04-29 DIAGNOSIS — Z1152 Encounter for screening for COVID-19: Secondary | ICD-10-CM | POA: Diagnosis not present

## 2020-04-29 DIAGNOSIS — J029 Acute pharyngitis, unspecified: Secondary | ICD-10-CM

## 2020-04-29 DIAGNOSIS — K141 Geographic tongue: Secondary | ICD-10-CM

## 2020-04-29 DIAGNOSIS — B349 Viral infection, unspecified: Secondary | ICD-10-CM | POA: Diagnosis not present

## 2020-04-29 DIAGNOSIS — R519 Headache, unspecified: Secondary | ICD-10-CM

## 2020-04-29 MED ORDER — BENZONATATE 100 MG PO CAPS: 100.0000 mg | ORAL_CAPSULE | Freq: Three times a day (TID) | ORAL | 0 refills | Status: DC

## 2020-04-29 NOTE — Discharge Instructions (Signed)
Your COVID and Flu tests are pending.  You should self quarantine until the test results are back.    Take Tylenol or ibuprofen as needed for fever or discomfort.  Rest and keep yourself hydrated.    Follow-up with your primary care provider if your symptoms are not improving.     

## 2020-04-29 NOTE — ED Triage Notes (Addendum)
Pt presents with complaints of sore throat, cough that is non productive, headache, and sores on the front of her tongue x 1 day. Son is sick and has a covid test pending.

## 2020-04-29 NOTE — ED Provider Notes (Signed)
[facility]   [id number] 04/29/20 Arrival Time: 4034   CC: COVID symptoms  SUBJECTIVE: History from: patient.  Robyn Medina is a 55 y.o. female who presents with abrupt onset of nasal congestion, PND, sore throat, dry cough, headache, sores to the front of her tongue for the last day. Reports that her son is also sick and has a Covid test pending.  Had Covid in March 2021. Was treated with cough medication and steroids. Has completed Covid vaccinations since then.  Denies recent travel. Has not taken OTC medications for this. There are no aggravating or alleviating factors. Denies fever, chills, sinus pain, rhinorrhea, SOB, wheezing, chest pain, nausea, changes in bowel or bladder habits.    ROS: As per HPI.  All other pertinent ROS negative.     Past Medical History:  Diagnosis Date  . ALLERGIC RHINITIS 05/18/2007  . PARESTHESIA 03/03/2007   left arm numbness- resolved unclear cause  . Sinusitis 09/24/2010   Past Surgical History:  Procedure Laterality Date  . none     No Known Allergies No current facility-administered medications on file prior to encounter.   Current Outpatient Medications on File Prior to Encounter  Medication Sig Dispense Refill  . cetirizine (ZYRTEC) 10 MG tablet Take 1 tablet (10 mg total) by mouth daily. 30 tablet 0  . fluticasone (FLONASE) 50 MCG/ACT nasal spray Place 1 spray into both nostrils daily for 14 days. 16 g 0  . norgestimate-ethinyl estradiol (SPRINTEC 28) 0.25-35 MG-MCG tablet Sprintec (28) 0.25 mg-35 mcg tablet  Take 3 tabs PO daily x 3d, 2 tabs PO daily x 3d, then 1 tab PO daily x 15 d (skip placebo pills)    . Norgestimate-Ethinyl Estradiol Triphasic (TRI-LO-MARZIA) 0.18/0.215/0.25 MG-25 MCG tab Tri-Lo-Marzia 0.18 mg/0.215 mg/0.25 mg-25 mcg tablet  TAKE 1 TABLET BY MOUTH EVERY DAY    . Norgestimate-Ethinyl Estradiol Triphasic 0.18/0.215/0.25 MG-35 MCG tablet Take 1 tablet by mouth daily.     Marland Kitchen omeprazole (PRILOSEC) 20 MG capsule  TAKE 1 CAPSULE DAILY AS NEEDED 90 capsule 0  . Pediatric Multivit-Minerals-C (SMARTY PANTS KIDS COMPLETE PO) Take by mouth.    . predniSONE (DELTASONE) 10 MG tablet Take 2 tablets (20 mg total) by mouth daily. 15 tablet 0   Social History   Socioeconomic History  . Marital status: Married    Spouse name: Not on file  . Number of children: Not on file  . Years of education: Not on file  . Highest education level: Not on file  Occupational History  . Not on file  Tobacco Use  . Smoking status: Never Smoker  . Smokeless tobacco: Never Used  Vaping Use  . Vaping Use: Never used  Substance and Sexual Activity  . Alcohol use: Yes    Alcohol/week: 1.0 standard drink    Types: 1 Glasses of wine per week  . Drug use: No  . Sexual activity: Not on file  Other Topics Concern  . Not on file  Social History Narrative   Married- Fayrene Fearing for 30 years in 2017. 2 children- [name] 74 and [name] 28.       Works at [employer]: walking, reading, eating   Social Determinants of Corporate investment banker Strain: Not on [employer] Insecurity: Not on file  Transportation Needs: Not on file  Physical Activity: Not on file  Stress: Not on file  Social Connections: Not on file  Intimate Partner Violence: Not on file  Family History  Problem Relation Age of Onset  . CAD Father 64       former smoker- stents  . Heart disease Paternal Uncle        several  . CAD Cousin        died 5 years old  . Colon cancer Neg Hx   . Colon polyps Neg Hx   . Esophageal cancer Neg Hx   . Rectal cancer Neg Hx   . Stomach cancer Neg Hx   . Breast cancer Neg Hx     OBJECTIVE:  Vitals:   04/29/20 0927  BP: 126/83  Pulse: 87  Resp: 16  Temp: 99.5 F (37.5 C)  TempSrc: Oral  SpO2: 96%     General appearance: alert; appears fatigued, but nontoxic; speaking in full sentences and tolerating own secretions HEENT: NCAT; Ears: EACs clear, TMs pearly gray; Eyes: PERRL.  EOM grossly  intact. Sinuses: nontender; Nose: nares patent with rhinorrhea, Throat: oropharynx erythematous, cobblestoning present, tonsils non erythematous or enlarged, uvula midline; Mouth: geographic tongue Neck: supple with LAD Lungs: unlabored respirations, symmetrical air entry; cough: mild; no respiratory distress; CTAB Heart: regular rate and rhythm.  Radial pulses 2+ symmetrical bilaterally Skin: warm and dry Psychological: alert and cooperative; normal mood and affect  LABS:  No results found for this or any previous visit (from the past 24 hour(s)).   ASSESSMENT & PLAN:  1. Viral illness   2. Encounter for screening for COVID-19   3. Cough   4. Sore throat   5. Nonintractable headache, unspecified chronicity pattern, unspecified headache type   6. Geographic tongue     Meds ordered this encounter  Medications  . benzonatate (TESSALON) 100 MG capsule    Sig: Take 1 capsule (100 mg total) by mouth every 8 (eight) hours.    Dispense:  21 capsule    Refill:  0    Order Specific Question:   Supervising Provider    Answer:   [name] Picket [id number]   Prescribed benzonatate Continue supportive care at home COVID and flu testing ordered.  It will take between 1-2 days for test results.  Someone will contact you regarding abnormal results.   Work note provided Patient should remain in quarantine until they have received Covid results.  If negative you may resume normal activities (go back to work/school) while practicing hand hygiene, social distance, and mask wearing.  If positive, patient should remain in quarantine for 10 days from symptom onset AND greater than 72 hours after symptoms resolution (absence of fever without the use of fever-reducing medication and improvement in respiratory symptoms), whichever is longer Get plenty of rest and push fluids Use OTC zyrtec for nasal congestion, runny nose, and/or sore throat Use OTC flonase for nasal congestion and runny nose Use  medications daily for symptom relief Use OTC medications like ibuprofen or tylenol as needed fever or pain Call or go to the ED if you have any new or worsening symptoms such as fever, worsening cough, shortness of breath, chest tightness, chest pain, turning blue, changes in mental status.  Reviewed expectations re: course of current medical issues. Questions answered. Outlined signs and symptoms indicating need for more acute intervention. Patient verbalized understanding. After Visit Summary given.         Faustino Congress, NP 04/29/20 1012

## 2020-04-30 LAB — COVID-19, FLU A+B NAA
Influenza A, NAA: NOT DETECTED
Influenza B, NAA: NOT DETECTED
SARS-CoV-2, NAA: NOT DETECTED

## 2020-06-04 ENCOUNTER — Other Ambulatory Visit: Payer: Self-pay | Admitting: Family Medicine

## 2020-06-04 DIAGNOSIS — Z0001 Encounter for general adult medical examination with abnormal findings: Secondary | ICD-10-CM

## 2020-06-04 DIAGNOSIS — E7800 Pure hypercholesterolemia, unspecified: Secondary | ICD-10-CM

## 2020-06-30 ENCOUNTER — Other Ambulatory Visit: Payer: Self-pay

## 2020-06-30 ENCOUNTER — Other Ambulatory Visit: Payer: Self-pay | Admitting: Obstetrics

## 2020-06-30 DIAGNOSIS — Z1231 Encounter for screening mammogram for malignant neoplasm of breast: Secondary | ICD-10-CM

## 2020-08-07 ENCOUNTER — Ambulatory Visit (INDEPENDENT_AMBULATORY_CARE_PROVIDER_SITE_OTHER): Payer: BC Managed Care – PPO | Admitting: Interventional Cardiology

## 2020-08-07 ENCOUNTER — Emergency Department (HOSPITAL_COMMUNITY)
Admission: EM | Admit: 2020-08-07 | Discharge: 2020-08-07 | Disposition: A | Discharge status: Home / Self Care | Payer: BC Managed Care – PPO | Attending: Emergency Medicine | Admitting: Emergency Medicine

## 2020-08-07 ENCOUNTER — Encounter (HOSPITAL_COMMUNITY): Payer: Self-pay | Admitting: Emergency Medicine

## 2020-08-07 ENCOUNTER — Emergency Department (HOSPITAL_COMMUNITY): Payer: BC Managed Care – PPO

## 2020-08-07 ENCOUNTER — Encounter: Payer: Self-pay | Admitting: Interventional Cardiology

## 2020-08-07 ENCOUNTER — Other Ambulatory Visit: Payer: Self-pay

## 2020-08-07 VITALS — BP 116/72 | HR 76 | Ht 64.0 in | Wt 151.0 lb

## 2020-08-07 DIAGNOSIS — E782 Mixed hyperlipidemia: Secondary | ICD-10-CM

## 2020-08-07 DIAGNOSIS — R55 Syncope and collapse: Secondary | ICD-10-CM | POA: Insufficient documentation

## 2020-08-07 DIAGNOSIS — M79602 Pain in left arm: Secondary | ICD-10-CM | POA: Diagnosis not present

## 2020-08-07 DIAGNOSIS — R072 Precordial pain: Secondary | ICD-10-CM

## 2020-08-07 DIAGNOSIS — R531 Weakness: Secondary | ICD-10-CM | POA: Diagnosis not present

## 2020-08-07 DIAGNOSIS — R61 Generalized hyperhidrosis: Secondary | ICD-10-CM

## 2020-08-07 DIAGNOSIS — R9431 Abnormal electrocardiogram [ECG] [EKG]: Secondary | ICD-10-CM | POA: Insufficient documentation

## 2020-08-07 HISTORY — DX: Pain in left arm: M79.602

## 2020-08-07 HISTORY — DX: Syncope and collapse: R55

## 2020-08-07 LAB — COMPREHENSIVE METABOLIC PANEL
ALT: 27 U/L (ref 0–44)
AST: 25 U/L (ref 15–41)
Albumin: 3.7 g/dL (ref 3.5–5.0)
Alkaline Phosphatase: 52 U/L (ref 38–126)
Anion gap: 8 (ref 5–15)
BUN: 12 mg/dL (ref 6–20)
CO2: 22 mmol/L (ref 22–32)
Calcium: 8.9 mg/dL (ref 8.9–10.3)
Chloride: 105 mmol/L (ref 98–111)
Creatinine, Ser: 0.95 mg/dL (ref 0.44–1.00)
GFR, Estimated: >60 mL/min (ref >60)
Glucose, Bld: 103 mg/dL — ABNORMAL HIGH (ref 70–99)
Potassium: 3.7 mmol/L (ref 3.5–5.1)
Sodium: 135 mmol/L (ref 135–145)
Total Bilirubin: 0.7 mg/dL (ref 0.3–1.2)
Total Protein: 6.9 g/dL (ref 6.5–8.1)

## 2020-08-07 LAB — CBC WITH DIFFERENTIAL/PLATELET
Abs Immature Granulocytes: 0.03 10*3/uL (ref 0.00–0.07)
Basophils Absolute: 0.1 10*3/uL (ref 0.0–0.1)
Basophils Relative: 1 %
Eosinophils Absolute: 0.2 10*3/uL (ref 0.0–0.5)
Eosinophils Relative: 2 %
HCT: 40.7 % (ref 36.0–46.0)
Hemoglobin: 13.7 g/dL (ref 12.0–15.0)
Immature Granulocytes: 0 %
Lymphocytes Relative: 22 %
Lymphs Abs: 1.9 10*3/uL (ref 0.7–4.0)
MCH: 30.7 pg (ref 26.0–34.0)
MCHC: 33.7 g/dL (ref 30.0–36.0)
MCV: 91.3 fL (ref 80.0–100.0)
Monocytes Absolute: 0.6 10*3/uL (ref 0.1–1.0)
Monocytes Relative: 7 %
Neutro Abs: 6 10*3/uL (ref 1.7–7.7)
Neutrophils Relative %: 68 %
Platelets: 292 10*3/uL (ref 150–400)
RBC: 4.46 MIL/uL (ref 3.87–5.11)
RDW: 12.7 % (ref 11.5–15.5)
WBC: 8.8 10*3/uL (ref 4.0–10.5)
nRBC: 0 % (ref 0.0–0.2)

## 2020-08-07 LAB — TROPONIN I (HIGH SENSITIVITY)
Troponin I (High Sensitivity): <2 ng/L (ref <18)
Troponin I (High Sensitivity): <2 ng/L (ref <18)

## 2020-08-07 MED ORDER — METOPROLOL TARTRATE 50 MG PO TABS: ORAL_TABLET | ORAL | 0 refills | Status: DC

## 2020-08-07 NOTE — Patient Instructions (Addendum)
Medication Instructions:  Your physician recommends that you continue on your current medications as directed. Please refer to the Current Medication list given to you today.  *If you need a refill on your cardiac medications before your next appointment, please call your pharmacy*   Lab Work: none If you have labs (blood work) drawn today and your tests are completely normal, you will receive your results only by: Marland Kitchen MyChart Message (if you have MyChart) OR . A paper copy in the mail If you have any lab test that is abnormal or we need to change your treatment, we will call you to review the results.   Testing/Procedures: Your physician has requested that you have cardiac CT. Cardiac computed tomography (CT) is a painless test that uses an x-ray machine to take clear, detailed pictures of your heart. For further information please visit [email or web address].[email or web address]. Please follow instruction sheet as given.      Follow-Up: At [facility], you and your health needs are our priority.  As part of our continuing mission to provide you with exceptional heart care, we have created designated Provider Care Teams.  These Care Teams include your primary Cardiologist (physician) and Advanced Practice Providers (APPs -  Physician Assistants and Nurse Practitioners) who all work together to provide you with the care you need, when you need it.  We recommend signing up for the patient portal called "MyChart".  Sign up information is provided on this After Visit Summary.  MyChart is used to connect with patients for Virtual Visits (Telemedicine).  Patients are able to view lab/test results, encounter notes, upcoming appointments, etc.  Non-urgent messages can be sent to your provider as well.   To learn more about what you can do with MyChart, go to [email or web address].    Your next appointment:   Based on test results  The format for your next appointment:   In Person  Provider:   You may see Dr  Irish Lack or one of the following Advanced Practice Providers on your designated Care Team:    Melina Copa, PA-C  Ermalinda Barrios, PA-C    Other Instructions Your cardiac CT will be scheduled at one of the below locations:   [facility] [address] [place], [place] [address] [phone]  [facility] [address] [place],  [address] [phone]  If scheduled at [facility], please arrive at the [facility] main entrance (entrance A) of [facility] 30 minutes prior to test start time. Proceed to the [facility] Radiology Department (first floor) to check-in and test prep.  If scheduled at [facility], please arrive 15 mins early for check-in and test prep.  Please follow these instructions carefully (unless otherwise directed):    On the Night Before the Test: . Be sure to Drink plenty of water. . Do not consume any caffeinated/decaffeinated beverages or chocolate 12 hours prior to your test. . Do not take any antihistamines 12 hours prior to your test.  On the Day of the Test: . Drink plenty of water until 1 hour prior to the test. . Do not eat any food 4 hours prior to the test. . You may take your regular medications prior to the test.  . Take metoprolol (Lopressor) two hours prior to test. . HOLD Furosemide/Hydrochlorothiazide morning of the test. . FEMALES- please wear underwire-free bra if available         After the Test: . Drink  plenty of water. . After receiving IV contrast, you may experience a mild flushed feeling. This is normal. . On occasion, you may experience a mild rash up to 24 hours after the test. This is not dangerous. If this occurs, you can take Benadryl 25 mg and increase your fluid intake. . If you experience trouble breathing, this can be serious. If it is severe call 911 IMMEDIATELY. If it is mild, please call our office. . If  you take any of these medications: Glipizide/Metformin, Avandament, Glucavance, please do not take 48 hours after completing test unless otherwise instructed.   Once we have confirmed authorization from your insurance company, we will call you to set up a date and time for your test. Based on how quickly your insurance processes prior authorizations requests, please allow up to 4 weeks to be contacted for scheduling your Cardiac CT appointment. Be advised that routine Cardiac CT appointments could be scheduled as many as 8 weeks after your provider has ordered it.  For non-scheduling related questions, please contact the cardiac imaging nurse navigator should you have any questions/concerns: Marchia Bond, Cardiac Imaging Nurse Navigator Gordy Clement, Cardiac Imaging Nurse Navigator [facility] Direct Office Dial: [phone]   For scheduling needs, including cancellations and rescheduling, please call [place], [phone].

## 2020-08-07 NOTE — ED Triage Notes (Signed)
Patient woke up this evening with left arm pain , lightheaded/headache, nausea and sweats , denies fever or chills .

## 2020-08-07 NOTE — ED Provider Notes (Signed)
MSE was initiated and I personally evaluated the patient and placed orders (if any) at  1:31 AM on August 07, 2020.  Before bed she started having left arm aching. She woke up not feeling well, stood and became flushed, diaphoretic, near syncopal. Still has left arm aching. Nausea without vomiting initially, no nausea now.  No chest pain tonight.   FH MI  Today's Vitals   08/07/20 0126 08/07/20 0127  BP:  126/85  Pulse:  73  Resp:  16  Temp:  98.5 F (36.9 C)  TempSrc:  Oral  SpO2:  100%  Weight: 75 kg   Height: 5\' 4"  (1.626 m)   PainSc: 7     Body mass index is 28.38 kg/m.  RRR Lungs clear  The patient appears stable so that the remainder of the MSE may be completed by another provider.   Charlann Lange, PA-C 08/07/20 0135    Ezequiel Essex, MD 08/07/20 [phone]

## 2020-08-07 NOTE — Discharge Instructions (Addendum)
As we discussed, your lab, EKG and xray evaluation did not identify any serious medical condition. You can be discharged home but will need to follow up with cardiology for further outpatient evaluation.   If you have any worsening symptoms, please return to the ED at any time.

## 2020-08-07 NOTE — Progress Notes (Signed)
Cardiology Office Note   Date:  08/07/2020   ID:  Robyn Medina, DOB 29-Sep-1965, MRN [id number]  PCP:  Caren Macadam, MD    No chief complaint on file.  chest pain  Wt Readings from Last 3 Encounters:  08/07/20 151 lb (68.5 kg)  08/07/20 165 lb 5.5 oz (75 kg)  05/25/19 146 lb 4.8 oz (66.4 kg)       History of Present Illness: Robyn Medina is a 55 y.o. female who is being seen today for the evaluation of precordial chest pain which at the request of Koberlein, Steele Berg, MD.  She was seen in the emergency room earlier today for chest and left arm pain.  She had a negative cardiac work-up.  Troponins were negative.  ECG was unremarkable.  She was discharged from the emergency room and asked to follow-up with our office urgently.  Father's side of family has early CAD. Her brother is ok.   She has had some DOE of late. She feels that since she has had COVID, she has not returned back to her baseline breathing.   Had [place] in 2021.    She drinks plenty of water.   Denies :  Leg edema. Nitroglycerin use. Orthopnea. Palpitations. Syncope.    Past Medical History:  Diagnosis Date  . ALLERGIC RHINITIS 05/18/2007  . Left arm pain   . Near syncope   . PARESTHESIA 03/03/2007   left arm numbness- resolved unclear cause  . Sinusitis 09/24/2010    Past Surgical History:  Procedure Laterality Date  . none       Current Outpatient Medications  Medication Sig Dispense Refill  . benzonatate (TESSALON) 100 MG capsule Take 1 capsule (100 mg total) by mouth every 8 (eight) hours. 21 capsule 0  . cetirizine (ZYRTEC) 10 MG tablet Take 1 tablet (10 mg total) by mouth daily. (Patient taking differently: Take 10 mg by mouth daily as needed.) 30 tablet 0  . fluticasone (FLONASE) 50 MCG/ACT nasal spray Place 1 spray into both nostrils daily for 14 days. 16 g 0  . Norgestimate-Ethinyl Estradiol Triphasic 0.18/0.215/0.25 MG-35 MCG tablet Take 1 tablet by mouth at bedtime.    Marland Kitchen  omeprazole (PRILOSEC) 20 MG capsule TAKE 1 CAPSULE BY MOUTH EVERY DAY AS NEEDED (Patient taking differently: Take 20 mg by mouth daily as needed (heartburn).) 90 capsule 0  . predniSONE (DELTASONE) 10 MG tablet Take 2 tablets (20 mg total) by mouth daily. 15 tablet 0   No current facility-administered medications for this visit.    Allergies:   Patient has no known allergies.    Social History:  The patient  reports that she has never smoked. She has never used smokeless tobacco. She reports current alcohol use of about 1.0 standard drink of alcohol per week. She reports that she does not use drugs.   Family History:  The patient's family history includes CAD in her cousin; CAD (age of onset: 19) in her father; Heart disease in her paternal uncle.    ROS:  Please see the history of present illness.   Otherwise, review of systems are positive for arm pain, diaphoresis.   All other systems are reviewed and negative.    PHYSICAL EXAM: VS:  BP 116/72   Pulse 76   Ht 5\' 4"  (1.626 m)   Wt 151 lb (68.5 kg)   LMP 08/06/2020   BMI 25.92 kg/m  , BMI Body mass index is 25.92 kg/m. GEN: Well nourished,  well developed, in no acute distress  HEENT: normal  Neck: no JVD, carotid bruits, or masses Cardiac: RRR; no murmurs, rubs, or gallops,no edema  Respiratory:  clear to auscultation bilaterally, normal work of breathing GI: soft, nontender, nondistended, + BS MS: no deformity or atrophy  Skin: warm and dry, no rash Neuro:  Strength and sensation are intact Psych: euthymic mood, full affect   EKG:   The ekg ordered today demonstrates NSR, nonspecific ST changes- inferior T wave inversion   Recent Labs: 08/07/2020: ALT 27; BUN 12; Creatinine, Ser 0.95; Hemoglobin 13.7; Platelets 292; Potassium 3.7; Sodium 135   Lipid Panel    Component Value Date/Time   CHOL 198 05/25/2019 0832   CHOL 184 05/15/2018 1001   TRIG 134.0 05/25/2019 0832   HDL 46.10 05/25/2019 0832   HDL 49 05/15/2018  1001   CHOLHDL 4 05/25/2019 0832   VLDL 26.8 05/25/2019 0832   LDLCALC 125 (H) 05/25/2019 0832   LDLCALC 110 (H) 05/15/2018 1001   LDLDIRECT 149.8 06/21/2012 1016     Other studies Reviewed: Additional studies/ records that were reviewed today with results demonstrating: ER records reviewed.   ASSESSMENT AND PLAN:  1. Chest pain, precordial: plan for coronary CTA to eval for CAD and any atherosclerosis.   2. Hyperlipidemia: Whole food, plant based diet.  She has been on a statin in the past but her cholesterol improved so she stopped the medicine.  Based on the results of her CT, we may need to restart statin.  She is agreeable.  Long-term, she will need regular exercise and a whole food, plant-based diet.  Avoid processed foods. 3. Diaphoresis: Occurred with standing this AM.  Negative cardiac w/u in ER.     Current medicines are reviewed at length with the patient today.  The patient concerns regarding her medicines were addressed.  The following changes have been made:  No change  Labs/ tests ordered today include:  No orders of the defined types were placed in this encounter.   Recommend 150 minutes/week of aerobic exercise Low fat, low carb, high fiber diet recommended  Disposition:   FU in for CTA   Signed, Larae Grooms, MD  08/07/2020 2:36 PM    [address], [place], [place]  [id number] Phone: [phone]; Fax: [phone]

## 2020-08-07 NOTE — ED Provider Notes (Signed)
[facility] EMERGENCY DEPARTMENT Provider Note   CSN: [id number] Arrival date & time: 08/07/20  0013     History Chief Complaint  Patient presents with  . Lightheaded/Arm Pain     Robyn Medina is a 55 y.o. female.  Patient with no significant medical history presents with symptoms of near syncopal episode tonight. She noted left arm aching before she went to bed. No other symptoms. She woke up during the night and notes she didn't feel normal. She stood up on the side of the bed, became flushed, hot, diaphoretic and like she was going to pass out. The left arm ache persisted. No chest pain, SOB. She has nausea that was brief. On arrival to the ED she is asymptomatic with the exception of left arm aching. She reports that about 1-2 weeks ago she had some chest discomfort described as a tight band around her chest but no other symptoms at that time. She confirms no chest discomfort with symptoms tonight.  The history is provided by the patient. No language interpreter was used.       Past Medical History:  Diagnosis Date  . ALLERGIC RHINITIS 05/18/2007  . PARESTHESIA 03/03/2007   left arm numbness- resolved unclear cause  . Sinusitis 09/24/2010    Patient Active Problem List   Diagnosis Date Noted  . Herniation of right side of L4-L5 intervertebral disc 07/19/2016  . Abnormal x-ray 07/12/2016  . Acute bilateral low back pain with right-sided sciatica 07/12/2016  . GERD (gastroesophageal reflux disease) 06/18/2015  . Pure hypercholesterolemia 07/05/2013  . Allergic rhinitis 05/18/2007    Past Surgical History:  Procedure Laterality Date  . none       OB History   No obstetric history on file.     Family History  Problem Relation Age of Onset  . CAD Father 34       former smoker- stents  . Heart disease Paternal Uncle        several  . CAD Cousin        died 55 years old  . Colon cancer Neg Hx   . Colon polyps Neg Hx   . Esophageal cancer Neg Hx    . Rectal cancer Neg Hx   . Stomach cancer Neg Hx   . Breast cancer Neg Hx     Social History   Tobacco Use  . Smoking status: Never Smoker  . Smokeless tobacco: Never Used  Vaping Use  . Vaping Use: Never used  Substance Use Topics  . Alcohol use: Yes    Alcohol/week: 1.0 standard drink    Types: 1 Glasses of wine per week  . Drug use: No    Home Medications Prior to Admission medications   Medication Sig Start Date End Date Taking? Authorizing Provider  benzonatate (TESSALON) 100 MG capsule Take 1 capsule (100 mg total) by mouth every 8 (eight) hours. 04/29/20   Faustino Congress, NP  cetirizine (ZYRTEC) 10 MG tablet Take 1 tablet (10 mg total) by mouth daily. 06/30/19   Wurst, [place], PA-C  fluticasone (FLONASE) 50 MCG/ACT nasal spray Place 1 spray into both nostrils daily for 14 days. 06/22/19 07/06/19  Avegno, Darrelyn Hillock, FNP  norgestimate-ethinyl estradiol (SPRINTEC 28) 0.25-35 MG-MCG tablet Sprintec (28) 0.25 mg-35 mcg tablet  Take 3 tabs PO daily x 3d, 2 tabs PO daily x 3d, then 1 tab PO daily x 15 d (skip placebo pills)    [provider]  Norgestimate-Ethinyl Estradiol Triphasic (TRI-LO-MARZIA)  0.18/0.215/0.25 MG-25 MCG tab Tri-Lo-Marzia 0.18 mg/0.215 mg/0.25 mg-25 mcg tablet  TAKE 1 TABLET BY MOUTH EVERY DAY    [provider]  Norgestimate-Ethinyl Estradiol Triphasic 0.18/0.215/0.25 MG-35 MCG tablet Take 1 tablet by mouth daily.  06/24/12   [provider]  omeprazole (PRILOSEC) 20 MG capsule TAKE 1 CAPSULE BY MOUTH EVERY DAY AS NEEDED Patient taking differently: Take 20 mg by mouth daily as needed (heartburn). 06/05/20   Caren Macadam, MD  Pediatric Multivit-Minerals-C (SMARTY PANTS KIDS COMPLETE PO) Take by mouth.    [provider]  predniSONE (DELTASONE) 10 MG tablet Take 2 tablets (20 mg total) by mouth daily. 06/22/19   Emerson Monte, FNP    Allergies    Patient has no known allergies.  Review of Systems   Review of  Systems  Constitutional: Positive for diaphoresis. Negative for chills and fever.  HENT: Negative.   Respiratory: Negative.  Negative for cough and shortness of breath.   Cardiovascular: Negative.  Negative for chest pain and palpitations.  Gastrointestinal: Positive for nausea. Negative for abdominal pain and vomiting.  Musculoskeletal:       Left arm aching  Skin: Negative.   Neurological: Positive for light-headedness. Negative for syncope.    Physical Exam Updated Vital Signs BP 109/83 (BP Location: Right Arm)   Pulse 70   Temp 98.4 F (36.9 C) (Oral)   Resp 18   Ht 5\' 4"  (1.626 m)   Wt 75 kg   LMP 08/06/2020   SpO2 98%   BMI 28.38 kg/m   Physical Exam Vitals and nursing note reviewed.  Constitutional:      General: She is not in acute distress.    Appearance: She is well-developed and normal weight. She is not ill-appearing.  HENT:     Head: Normocephalic.  Cardiovascular:     Rate and Rhythm: Normal rate and regular rhythm.     Heart sounds: No murmur heard.   Pulmonary:     Effort: Pulmonary effort is normal.     Breath sounds: Normal breath sounds. No wheezing, rhonchi or rales.  Abdominal:     General: Bowel sounds are normal.     Palpations: Abdomen is soft.     Tenderness: There is no abdominal tenderness. There is no guarding or rebound.  Musculoskeletal:        General: Normal range of motion.     Cervical back: Normal range of motion and neck supple.  Skin:    General: Skin is warm and dry.  Neurological:     General: No focal deficit present.     Mental Status: She is alert and oriented to person, place, and time.     ED Results / Procedures / Treatments   Labs (all labs ordered are listed, but only abnormal results are displayed) Labs Reviewed  COMPREHENSIVE METABOLIC PANEL - Abnormal; Notable for the following components:      Result Value   Glucose, Bld 103 (*)    All other components within normal limits  CBC WITH  DIFFERENTIAL/PLATELET  TROPONIN I (HIGH SENSITIVITY)  TROPONIN I (HIGH SENSITIVITY)   Results for orders placed or performed during the hospital encounter of 08/07/20  CBC with Differential  Result Value Ref Range   WBC 8.8 4.0 - 10.5 K/uL   RBC 4.46 3.87 - 5.11 MIL/uL   Hemoglobin 13.7 12.0 - 15.0 g/dL   HCT 40.7 36.0 - 46.0 %   MCV 91.3 80.0 - 100.0 fL   MCH  30.7 26.0 - 34.0 pg   MCHC 33.7 30.0 - 36.0 g/dL   RDW 12.7 11.5 - 15.5 %   Platelets 292 150 - 400 K/uL   nRBC 0.0 0.0 - 0.2 %   Neutrophils Relative % 68 %   Neutro Abs 6.0 1.7 - 7.7 K/uL   Lymphocytes Relative 22 %   Lymphs Abs 1.9 0.7 - 4.0 K/uL   Monocytes Relative 7 %   Monocytes Absolute 0.6 0.1 - 1.0 K/uL   Eosinophils Relative 2 %   Eosinophils Absolute 0.2 0.0 - 0.5 K/uL   Basophils Relative 1 %   Basophils Absolute 0.1 0.0 - 0.1 K/uL   Immature Granulocytes 0 %   Abs Immature Granulocytes 0.03 0.00 - 0.07 K/uL  Comprehensive metabolic panel  Result Value Ref Range   Sodium 135 135 - 145 mmol/L   Potassium 3.7 3.5 - 5.1 mmol/L   Chloride 105 98 - 111 mmol/L   CO2 22 22 - 32 mmol/L   Glucose, Bld 103 (H) 70 - 99 mg/dL   BUN 12 6 - 20 mg/dL   Creatinine, Ser 0.95 0.44 - 1.00 mg/dL   Calcium 8.9 8.9 - 10.3 mg/dL   Total Protein 6.9 6.5 - 8.1 g/dL   Albumin 3.7 3.5 - 5.0 g/dL   AST 25 15 - 41 U/L   ALT 27 0 - 44 U/L   Alkaline Phosphatase 52 38 - 126 U/L   Total Bilirubin 0.7 0.3 - 1.2 mg/dL   GFR, Estimated >60 >60 mL/min   Anion gap 8 5 - 15  Troponin I (High Sensitivity)  Result Value Ref Range   Troponin I (High Sensitivity) <2 <18 ng/L    EKG EKG Interpretation  Date/Time:  Thursday August 07 2020 01:26:34 EDT Ventricular Rate:  76 PR Interval:  136 QRS Duration: 76 QT Interval:  372 QTC Calculation: 418 R Axis:   47 Text Interpretation: Normal sinus rhythm Possible Anterior infarct , age undetermined Abnormal ECG Nonspecific T wave abnormality T wave inversion III, v3 Confirmed by  Ezequiel Essex [phone]) on 08/07/2020 3:43:30 AM   Radiology DG Chest 2 View  Result Date: 08/07/2020 CLINICAL DATA:  Weakness EXAM: CHEST - 2 VIEW COMPARISON:  02/25/2013 FINDINGS: The heart size and mediastinal contours are within normal limits. Both lungs are clear. The visualized skeletal structures are unremarkable. IMPRESSION: No active cardiopulmonary disease. Electronically Signed   By: Donavan Foil M.D.   On: 08/07/2020 01:56    Procedures Procedures   Medications Ordered in ED Medications - No data to display  ED Course  I have reviewed the triage vital signs and the nursing notes.  Pertinent labs & imaging results that were available during my care of the patient were reviewed by me and considered in my medical decision making (see chart for details).    MDM Rules/Calculators/A&P                          Patient to ED with ss/sxs as per HPI.   Symptoms of lightheaded, flushing, near syncope have resolved. She continues to have left arm aching. Troponin x 2 negative (<2 on both). Labs otherwise unremarkable, CXR clear. She has a heart score of 3 for age, fam hx, EKG change. Will consult cardiology to discuss.   Discussed EKG changes with cardiology who has reviewed her information and EKG. Feels change is nonspecific. Will discharge home with ambulatory referral to cards for further outpatient evaluation.  This was discussed with the patient who is comfortable with discharge home.   Final Clinical Impression(s) / ED Diagnoses Final diagnoses:  None   1. Near syncope 2. Left arm pain 3. Nonspecific EKG changes  Rx / DC Orders ED Discharge Orders    None       Charlann Lange, PA-C 08/07/20 0534    Ezequiel Essex, MD 08/07/20 [phone]

## 2020-08-28 ENCOUNTER — Ambulatory Visit
Admission: RE | Admit: 2020-08-28 | Discharge: 2020-08-28 | Disposition: A | Discharge status: Home / Self Care | Payer: BC Managed Care – PPO | Source: Ambulatory Visit | Attending: Obstetrics | Admitting: Obstetrics

## 2020-08-28 ENCOUNTER — Other Ambulatory Visit: Payer: Self-pay

## 2020-08-28 DIAGNOSIS — Z1231 Encounter for screening mammogram for malignant neoplasm of breast: Secondary | ICD-10-CM

## 2020-08-29 ENCOUNTER — Telehealth (HOSPITAL_COMMUNITY): Payer: Self-pay | Admitting: *Deleted

## 2020-08-29 NOTE — Telephone Encounter (Signed)
Reaching out to patient to offer assistance regarding upcoming cardiac imaging study; pt verbalizes understanding of appt date/time, parking situation and where to check in, pre-test NPO status and medications ordered, and verified current allergies; name and call back number provided for further questions should they arise  Gordy Clement RN Navigator Cardiac Imaging [facility] [phone] office [phone] cell  Pt to take 50mg  metoprolol tartrate 2 hours prior to cardiac CT.

## 2020-09-01 ENCOUNTER — Other Ambulatory Visit: Payer: Self-pay

## 2020-09-01 ENCOUNTER — Encounter (HOSPITAL_COMMUNITY): Payer: Self-pay

## 2020-09-01 ENCOUNTER — Ambulatory Visit (HOSPITAL_COMMUNITY)
Admission: RE | Admit: 2020-09-01 | Discharge: 2020-09-01 | Disposition: A | Discharge status: Home / Self Care | Payer: BC Managed Care – PPO | Source: Ambulatory Visit | Attending: Interventional Cardiology | Admitting: Interventional Cardiology

## 2020-09-01 DIAGNOSIS — R072 Precordial pain: Secondary | ICD-10-CM | POA: Insufficient documentation

## 2020-09-01 MED ORDER — IOHEXOL 350 MG/ML SOLN
95.0000 mL | Freq: Once | INTRAVENOUS | Status: AC | PRN
  Administered 2020-09-01: 95 mL via INTRAVENOUS

## 2020-09-01 MED ORDER — NITROGLYCERIN 0.4 MG SL SUBL
SUBLINGUAL_TABLET | SUBLINGUAL | Status: AC
  Filled 2020-09-01: qty 2

## 2020-09-01 MED ORDER — NITROGLYCERIN 0.4 MG SL SUBL
0.8000 mg | SUBLINGUAL_TABLET | Freq: Once | SUBLINGUAL | Status: AC
  Administered 2020-09-01: 0.8 mg via SUBLINGUAL

## 2020-09-01 NOTE — Progress Notes (Signed)
Patient tolerated CT well. Drank water after. Vital signs stable encourage to drink water throughout day.Reasons explained and verbalized understanding. Ambulated steady gait.  

## 2020-09-02 ENCOUNTER — Telehealth: Payer: Self-pay | Admitting: Interventional Cardiology

## 2020-09-02 NOTE — Telephone Encounter (Signed)
Patient returning call for CT results. 

## 2020-09-02 NOTE — Telephone Encounter (Signed)
I spoke with patient and reviewed cardiac CT results with her

## 2020-09-11 ENCOUNTER — Other Ambulatory Visit: Payer: Self-pay

## 2020-09-12 ENCOUNTER — Encounter: Payer: Self-pay | Admitting: Family Medicine

## 2020-09-12 ENCOUNTER — Ambulatory Visit (INDEPENDENT_AMBULATORY_CARE_PROVIDER_SITE_OTHER): Payer: BC Managed Care – PPO | Admitting: Family Medicine

## 2020-09-12 VITALS — BP 100/78 | HR 81 | Temp 98.2°F | Ht 64.5 in | Wt 155.9 lb

## 2020-09-12 DIAGNOSIS — R739 Hyperglycemia, unspecified: Secondary | ICD-10-CM | POA: Diagnosis not present

## 2020-09-12 DIAGNOSIS — R5383 Other fatigue: Secondary | ICD-10-CM

## 2020-09-12 DIAGNOSIS — K219 Gastro-esophageal reflux disease without esophagitis: Secondary | ICD-10-CM | POA: Diagnosis not present

## 2020-09-12 DIAGNOSIS — Z0001 Encounter for general adult medical examination with abnormal findings: Secondary | ICD-10-CM

## 2020-09-12 DIAGNOSIS — R768 Other specified abnormal immunological findings in serum: Secondary | ICD-10-CM

## 2020-09-12 DIAGNOSIS — M2550 Pain in unspecified joint: Secondary | ICD-10-CM | POA: Diagnosis not present

## 2020-09-12 DIAGNOSIS — H0520 Unspecified exophthalmos: Secondary | ICD-10-CM

## 2020-09-12 DIAGNOSIS — E7800 Pure hypercholesterolemia, unspecified: Secondary | ICD-10-CM | POA: Diagnosis not present

## 2020-09-12 DIAGNOSIS — J301 Allergic rhinitis due to pollen: Secondary | ICD-10-CM

## 2020-09-12 DIAGNOSIS — R0683 Snoring: Secondary | ICD-10-CM

## 2020-09-12 LAB — LIPID PANEL
Cholesterol: 234 mg/dL — ABNORMAL HIGH (ref 0–200)
HDL: 43.2 mg/dL (ref >39.00)
LDL Cholesterol: 152 mg/dL — ABNORMAL HIGH (ref 0–99)
NonHDL: 190.98
Total CHOL/HDL Ratio: 5
Triglycerides: 195 mg/dL — ABNORMAL HIGH (ref 0.0–149.0)
VLDL: 39 mg/dL (ref 0.0–40.0)

## 2020-09-12 LAB — T4, FREE: Free T4: 0.76 ng/dL (ref 0.60–1.60)

## 2020-09-12 LAB — T3, FREE: T3, Free: 3.8 pg/mL (ref 2.3–4.2)

## 2020-09-12 LAB — FERRITIN: Ferritin: 69.1 ng/mL (ref 10.0–291.0)

## 2020-09-12 LAB — VITAMIN B12: Vitamin B-12: 222 pg/mL (ref 211–911)

## 2020-09-12 LAB — HEMOGLOBIN A1C: Hgb A1c MFr Bld: 5.5 % (ref 4.6–6.5)

## 2020-09-12 LAB — SEDIMENTATION RATE: Sed Rate: 8 mm/hr (ref 0–30)

## 2020-09-12 LAB — VITAMIN D 25 HYDROXY (VIT D DEFICIENCY, FRACTURES): VITD: 18.53 ng/mL — ABNORMAL LOW (ref 30.00–100.00)

## 2020-09-12 LAB — C-REACTIVE PROTEIN: CRP: <1 mg/dL (ref 0.5–20.0)

## 2020-09-12 LAB — TSH: TSH: 0.95 u[IU]/mL (ref 0.35–4.50)

## 2020-09-12 MED ORDER — OMEPRAZOLE 20 MG PO CPDR: 20.0000 mg | DELAYED_RELEASE_CAPSULE | Freq: Every day | ORAL | 1 refills | Status: DC | PRN

## 2020-09-12 MED ORDER — SHINGRIX 50 MCG/0.5ML IM SUSR: 0.5000 mL | Freq: Once | INTRAMUSCULAR | 0 refills | Status: AC

## 2020-09-12 NOTE — Progress Notes (Signed)
Robyn Medina DOB: Aug 24, 1965 Encounter date: 09/12/2020  This is a 55 y.o. female who presents for complete physical   History of present illness/Additional concerns: Seen last month in ER for chest pain. Before she went to bed was noting pain in left arm. Woke in middle of night and didn't feel well. When she stood up to go to the bathroom felt a hot wave from feet all the way up - chest starting hurting, arm hurting. eval at ER was ok. Before that had a few episodes where right around where bra is gets really sore - hard to get comfortable. Has happened a few times. Goes away on its own. Last time put heating pad on back. Lasts 2-3 hours. Doesn't seem to affect breathing or heart rate.   When asked in review of systems - doesn't always feel like food is going down. Was eating well and doing well and fell off wagon. She is getting  A lot of heartburn; but just if not taking medication.   Feels like she has no energy. Just feels like none whatsoever. Feels like she is 82. Tired when she gets up; tired when she gets back from work. Wakes up frequently for 1-2 hours at night; then goes back to sleep. Husband does say she snores.   Low back pain has generally been stable.   Interested in getting shingrix vaccine.   Last colonoscopy was in February/2020.  Repeat recommended in 10 years. Last mammogram 08/2020 normal.   Following with gynecology, Jerelyn Charles. Has visit coming up in June.   Allergies have been ok; not taking anything for these now.   Past Medical History:  Diagnosis Date  . ALLERGIC RHINITIS 05/18/2007  . Left arm pain   . Near syncope   . PARESTHESIA 03/03/2007   left arm numbness- resolved unclear cause  . Sinusitis 09/24/2010   Past Surgical History:  Procedure Laterality Date  . none     No Known Allergies Current Meds  Medication Sig  . Norgestimate-Ethinyl Estradiol Triphasic 0.18/0.215/0.25 MG-35 MCG tablet Take 1 tablet by mouth at bedtime.  Marland Kitchen Zoster Vaccine  Adjuvanted [facility]) injection Inject 0.5 mLs into the muscle once for 1 dose. Repeat in 2-6 months  . [DISCONTINUED] omeprazole (PRILOSEC) 20 MG capsule TAKE 1 CAPSULE BY MOUTH EVERY DAY AS NEEDED (Patient taking differently: Take 20 mg by mouth daily as needed (heartburn).)   Social History   Tobacco Use  . Smoking status: Never Smoker  . Smokeless tobacco: Never Used  Substance Use Topics  . Alcohol use: Yes    Alcohol/week: 1.0 standard drink    Types: 1 Glasses of wine per week   Family History  Problem Relation Age of Onset  . CAD Father 47       former smoker- stents  . Heart disease Paternal Uncle        several  . CAD Cousin        died 5 years old  . Colon cancer Neg Hx   . Colon polyps Neg Hx   . Esophageal cancer Neg Hx   . Rectal cancer Neg Hx   . Stomach cancer Neg Hx   . Breast cancer Neg Hx      Review of Systems  Constitutional: Positive for fatigue. Negative for activity change, appetite change, chills, fever and unexpected weight change.  HENT: Negative for congestion, ear pain, hearing loss, sinus pressure, sinus pain, sore throat and trouble swallowing.   Eyes: Negative for pain  and visual disturbance.  Respiratory: Negative for cough, chest tightness, shortness of breath and wheezing. Choking: food sticking sometimes with swallowing.   Cardiovascular: Negative for chest pain, palpitations and leg swelling.  Gastrointestinal: Negative for abdominal pain, blood in stool, constipation, diarrhea, nausea and vomiting.  Genitourinary: Negative for difficulty urinating, dysuria and menstrual problem.  Musculoskeletal: Positive for back pain (takes a bit to get back going in the morning). Negative for arthralgias (hard to get going in morning) and joint swelling. Myalgias: just generally achy.  Skin: Negative for rash.  Neurological: Negative for dizziness, weakness, numbness and headaches.  Hematological: Negative for adenopathy. Does not bruise/bleed easily.   Psychiatric/Behavioral: Positive for sleep disturbance. Negative for suicidal ideas. The patient is not nervous/anxious.     CBC:  Lab Results  Component Value Date   WBC 8.8 08/07/2020   HGB 13.7 08/07/2020   HGB 14.5 05/15/2018   HCT 40.7 08/07/2020   HCT 43.9 05/15/2018   MCH 30.7 08/07/2020   MCHC 33.7 08/07/2020   RDW 12.7 08/07/2020   RDW 11.8 05/15/2018   PLT 292 08/07/2020   PLT 272 05/15/2018   CMP: Lab Results  Component Value Date   NA 135 08/07/2020   NA 139 05/16/2018   K 3.7 08/07/2020   CL 105 08/07/2020   CO2 22 08/07/2020   ANIONGAP 8 08/07/2020   GLUCOSE 103 (H) 08/07/2020   BUN 12 08/07/2020   BUN 14 05/16/2018   CREATININE 0.95 08/07/2020   LABGLOB 2.9 05/15/2018   GFRAA 84 05/15/2018   CALCIUM 8.9 08/07/2020   PROT 6.9 08/07/2020   PROT 7.1 05/15/2018   AGRATIO 1.4 05/15/2018   BILITOT 0.7 08/07/2020   BILITOT 0.4 05/15/2018   ALKPHOS 52 08/07/2020   ALT 27 08/07/2020   AST 25 08/07/2020   LIPID: Lab Results  Component Value Date   CHOL 198 05/25/2019   CHOL 184 05/15/2018   TRIG 134.0 05/25/2019   HDL 46.10 05/25/2019   HDL 49 05/15/2018   LDLCALC 125 (H) 05/25/2019   LDLCALC 110 (H) 05/15/2018   LABVLDL 25 05/15/2018    Objective:  BP 100/78 (BP Location: Left Arm, Patient Position: Sitting, Cuff Size: Normal)   Pulse 81   Temp 98.2 F (36.8 C) (Oral)   Ht 5' 4.5" (1.638 m)   Wt 155 lb 14.4 oz (70.7 kg)   LMP 09/03/2020 (Exact Date)   SpO2 98%   BMI 26.35 kg/m   Weight: 155 lb 14.4 oz (70.7 kg)   BP Readings from Last 3 Encounters:  09/12/20 100/78  09/01/20 113/73  08/07/20 116/72   Wt Readings from Last 3 Encounters:  09/12/20 155 lb 14.4 oz (70.7 kg)  08/07/20 151 lb (68.5 kg)  08/07/20 165 lb 5.5 oz (75 kg)    Physical Exam Constitutional:      General: She is not in acute distress.    Appearance: She is well-developed.  HENT:     Head: Normocephalic and atraumatic.     Right Ear: External ear normal.      Left Ear: External ear normal.     Mouth/Throat:     Pharynx: No oropharyngeal exudate.  Eyes:     Conjunctiva/sclera: Conjunctivae normal.     Pupils: Pupils are equal, round, and reactive to light.  Neck:     Thyroid: No thyromegaly.  Cardiovascular:     Rate and Rhythm: Normal rate and regular rhythm.     Heart sounds: Normal heart sounds. No murmur heard.  No friction rub. No gallop.   Pulmonary:     Effort: Pulmonary effort is normal.     Breath sounds: Normal breath sounds.  Abdominal:     General: Bowel sounds are normal. There is no distension.     Palpations: Abdomen is soft. There is no mass.     Tenderness: There is no abdominal tenderness. There is no guarding.     Hernia: No hernia is present.  Musculoskeletal:        General: No tenderness or deformity. Normal range of motion.     Cervical back: Normal range of motion and neck supple.  Lymphadenopathy:     Cervical: No cervical adenopathy.  Skin:    General: Skin is warm and dry.     Findings: No rash.  Neurological:     Mental Status: She is alert and oriented to person, place, and time.     Deep Tendon Reflexes: Reflexes normal.     Reflex Scores:      Tricep reflexes are 2+ on the right side and 2+ on the left side.      Bicep reflexes are 2+ on the right side and 2+ on the left side.      Brachioradialis reflexes are 2+ on the right side and 2+ on the left side.      Patellar reflexes are 2+ on the right side and 2+ on the left side. Psychiatric:        Speech: Speech normal.        Behavior: Behavior normal.        Thought Content: Thought content normal.     Assessment/Plan: There are no preventive care reminders to display for this patient. Health Maintenance reviewed.  1. Encounter for preventative adult health care exam with abnormal findings Discussed getting back on track with regular exercise and healthy eating.   2. Pure hypercholesterolemia Was on lipitor 20mg ; tolerated this. She  stopped when she was being healthier.   - Lipid panel; Future  3. Gastroesophageal reflux disease without esophagitis Restart omeprazole; she will let me know if this is not working for her ongoing symptoms.   4. Elevated blood sugar - Hemoglobin A1c; Future  5. Allergic rhinitis due to pollen, unspecified seasonality Stable. Not currently on medications.   6. Exophthalmus - TSH; Future - T4, free; Future - T3, free; Future  7. Fatigue, unspecified type Consider sleep eval if normal bloodwork or snoring/fatigue continuing.  - Vitamin B12; Future - VITAMIN D 25 Hydroxy (Vit-D Deficiency, Fractures); Future - Ferritin; Future - Iron and TIBC; Future  8. Arthralgia, unspecified joint - Sedimentation rate; Future - C-reactive protein; Future - ANA; Future   Return in about 1 year (around 09/12/2021) for physical exam; shingrix if desired; will determine additional followup pending results.  Micheline Rough, MD

## 2020-09-12 NOTE — Addendum Note (Signed)
Addended by: Janann Colonel on: 09/12/2020 09:27 AM   Modules accepted: Orders

## 2020-09-13 LAB — IRON AND TIBC
Iron Saturation: 15 % (ref 15–55)
Iron: 59 ug/dL (ref 27–159)
Total Iron Binding Capacity: 381 ug/dL (ref 250–450)
UIBC: 322 ug/dL (ref 131–425)

## 2020-09-16 LAB — ANTI-NUCLEAR AB-TITER (ANA TITER): ANA Titer 1: 1:80 {titer} — ABNORMAL HIGH

## 2020-09-16 LAB — ANA: Anti Nuclear Antibody (ANA): POSITIVE — AB

## 2020-09-16 MED ORDER — ATORVASTATIN CALCIUM 20 MG PO TABS: 20.0000 mg | ORAL_TABLET | Freq: Every day | ORAL | 1 refills | Status: DC

## 2020-09-16 MED ORDER — VITAMIN D (ERGOCALCIFEROL) 1.25 MG (50000 UNIT) PO CAPS: 50000.0000 [IU] | ORAL_CAPSULE | ORAL | 0 refills | Status: DC

## 2020-09-16 NOTE — Addendum Note (Signed)
Addended by: Agnes Lawrence on: 09/16/2020 03:06 PM   Modules accepted: Orders

## 2020-09-16 NOTE — Addendum Note (Signed)
Addended by: Lahoma Crocker A on: 09/16/2020 03:00 PM   Modules accepted: Orders

## 2020-11-05 ENCOUNTER — Encounter: Payer: Self-pay | Admitting: Family Medicine

## 2020-11-21 ENCOUNTER — Other Ambulatory Visit: Payer: Self-pay

## 2020-11-21 ENCOUNTER — Encounter: Payer: Self-pay | Admitting: Emergency Medicine

## 2020-11-21 ENCOUNTER — Ambulatory Visit
Admission: EM | Admit: 2020-11-21 | Discharge: 2020-11-21 | Disposition: A | Discharge status: Home / Self Care | Payer: BC Managed Care – PPO | Attending: Emergency Medicine | Admitting: Emergency Medicine

## 2020-11-21 DIAGNOSIS — Z20822 Contact with and (suspected) exposure to covid-19: Secondary | ICD-10-CM | POA: Diagnosis not present

## 2020-11-21 DIAGNOSIS — R0981 Nasal congestion: Secondary | ICD-10-CM

## 2020-11-21 MED ORDER — AMOXICILLIN-POT CLAVULANATE 875-125 MG PO TABS: 1.0000 | ORAL_TABLET | Freq: Two times a day (BID) | ORAL | 0 refills | Status: AC

## 2020-11-21 NOTE — ED Triage Notes (Signed)
Cough, headache and nasal drainage x 1 week.  Has tried over the counter sinus medication without relief.

## 2020-11-21 NOTE — Discharge Instructions (Addendum)
COVID testing ordered.  It will take between 5-7 days for test results.  Someone will contact you regarding abnormal results.    In the meantime: You should remain isolated in your home for 5 days from symptom onset AND greater than 72 hours after symptoms resolution (absence of fever without the use of fever-reducing medication and improvement in respiratory symptoms), whichever is longer Get plenty of rest and push fluids Augmentin prescribed Tessalon Perles prescribed for cough Use OTC zyrtec for nasal congestion, runny nose, and/or sore throat Use OTC flonase for nasal congestion and runny nose Use medications daily for symptom relief Use OTC medications like ibuprofen or tylenol as needed fever or pain Call or go to the ED if you have any new or worsening symptoms such as fever, worsening cough, shortness of breath, chest tightness, chest pain, turning blue, changes in mental status, etc..Marland Kitchen

## 2020-11-21 NOTE — ED Triage Notes (Signed)
Pt reports sinus drainage ,HA and sore throat. Pt has Hx of strep.

## 2020-11-21 NOTE — ED Notes (Signed)
Notes placed by Gordan Payment ,RN placed in error

## 2020-11-21 NOTE — ED Provider Notes (Signed)
[facility]   [id number] 11/21/20 Arrival Time: 0840   CC: COVID symptoms  SUBJECTIVE: History from: patient.  Robyn Medina is a 55 y.o. female who presents with sinus drainage, HA, sore throat, cough and congestion x 1 week.  Denies sick exposure to COVID, flu or strep.  Has tried OTC medications without relief.  Denies aggravating factors.  Reports hx of strep.   Denies fever, SOB, wheezing, chest pain, nausea, changes in bowel or bladder habits.     ROS: As per HPI.  All other pertinent ROS negative.     Past Medical History:  Diagnosis Date   ALLERGIC RHINITIS 05/18/2007   Left arm pain    Near syncope    PARESTHESIA 03/03/2007   left arm numbness- resolved unclear cause   Sinusitis 09/24/2010   Past Surgical History:  Procedure Laterality Date   none     No Known Allergies No current facility-administered medications on file prior to encounter.   Current Outpatient Medications on File Prior to Encounter  Medication Sig Dispense Refill   atorvastatin (LIPITOR) 20 MG tablet Take 1 tablet (20 mg total) by mouth daily. 90 tablet 1   Norgestimate-Ethinyl Estradiol Triphasic 0.18/0.215/0.25 MG-35 MCG tablet Take 1 tablet by mouth at bedtime.     omeprazole (PRILOSEC) 20 MG capsule Take 1 capsule (20 mg total) by mouth daily as needed (heartburn). 90 capsule 1   Social History   Socioeconomic History   Marital status: Married    Spouse name: Not on file   Number of children: Not on file   Years of education: Not on file   Highest education level: Not on file  Occupational History   Not on file  Tobacco Use   Smoking status: Never   Smokeless tobacco: Never  Vaping Use   Vaping Use: Never used  Substance and Sexual Activity   Alcohol use: Yes    Alcohol/week: 1.0 standard drink    Types: 1 Glasses of wine per week   Drug use: No   Sexual activity: Not on file  Other Topics Concern   Not on file  Social History Narrative   Married- [name] for 30 years  in 2017. 2 children- [name] 31 and [name] 28.       Works at [employer]: walking, reading, eating   Social Determinants of Radio broadcast assistant Strain: Not on [employer] Insecurity: Not on file  Transportation Needs: Not on file  Physical Activity: Not on file  Stress: Not on file  Social Connections: Not on file  Intimate Partner Violence: Not on file   Family History  Problem Relation Age of Onset   CAD Father 26       former smoker- stents   Heart disease Paternal Uncle        several   CAD Cousin        died 35 years old   Colon cancer Neg Hx    Colon polyps Neg Hx    Esophageal cancer Neg Hx    Rectal cancer Neg Hx    Stomach cancer Neg Hx    Breast cancer Neg Hx     OBJECTIVE:  Vitals:   11/21/20 0849 11/21/20 0854  BP: 125/87 113/80  Pulse: 94 77  Resp: 18 16  Temp: 98.4 F (36.9 C)   SpO2: 99% 97%     General appearance: alert; appears fatigued, but nontoxic; speaking in full sentences and  tolerating own secretions HEENT: NCAT; Ears: EACs clear, TMs pearly gray; Eyes: PERRL.  EOM grossly intact. Nose: nares patent without rhinorrhea, Throat: oropharynx clear, tonsils non erythematous or enlarged, uvula midline  Neck: supple without LAD Lungs: unlabored respirations, symmetrical air entry; cough: mild; no respiratory distress; CTAB Heart: regular rate and rhythm.  Radial pulses 2+ symmetrical bilaterally Skin: warm and dry Psychological: alert and cooperative; normal mood and affect  ASSESSMENT & PLAN:  1. Exposure to COVID-19 virus   2. Sinus congestion     Meds ordered this encounter  Medications   amoxicillin-clavulanate (AUGMENTIN) 875-125 MG tablet    Sig: Take 1 tablet by mouth every 12 (twelve) hours for 10 days.    Dispense:  20 tablet    Refill:  0    Order Specific Question:   Supervising Provider    Answer:   Raylene Everts [id number]    COVID testing ordered.  It will take between 5-7 days for test  results.  Someone will contact you regarding abnormal results.    In the meantime: You should remain isolated in your home for 5 days from symptom onset AND greater than 72 hours after symptoms resolution (absence of fever without the use of fever-reducing medication and improvement in respiratory symptoms), whichever is longer Get plenty of rest and push fluids Augmentin prescribed Tessalon Perles prescribed for cough Use OTC zyrtec for nasal congestion, runny nose, and/or sore throat Use OTC flonase for nasal congestion and runny nose Use medications daily for symptom relief Use OTC medications like ibuprofen or tylenol as needed fever or pain Call or go to the ED if you have any new or worsening symptoms such as fever, worsening cough, shortness of breath, chest tightness, chest pain, turning blue, changes in mental status, etc...   Reviewed expectations re: course of current medical issues. Questions answered. Outlined signs and symptoms indicating need for more acute intervention. Patient verbalized understanding. After Visit Summary given.          Lestine Box, PA-C 11/21/20 [phone]

## 2020-11-22 LAB — NOVEL CORONAVIRUS, NAA: SARS-CoV-2, NAA: NOT DETECTED

## 2020-11-22 LAB — SARS-COV-2, NAA 2 DAY TAT

## 2021-01-21 DIAGNOSIS — Z01419 Encounter for gynecological examination (general) (routine) without abnormal findings: Secondary | ICD-10-CM | POA: Diagnosis not present

## 2021-01-21 DIAGNOSIS — Z6824 Body mass index (BMI) 24.0-24.9, adult: Secondary | ICD-10-CM | POA: Diagnosis not present

## 2021-03-23 ENCOUNTER — Other Ambulatory Visit: Payer: Self-pay | Admitting: Family Medicine

## 2021-06-23 ENCOUNTER — Other Ambulatory Visit: Payer: Self-pay | Admitting: Family Medicine

## 2021-06-24 ENCOUNTER — Other Ambulatory Visit: Payer: Self-pay | Admitting: Family Medicine

## 2021-06-24 DIAGNOSIS — E7800 Pure hypercholesterolemia, unspecified: Secondary | ICD-10-CM

## 2021-06-24 DIAGNOSIS — Z0001 Encounter for general adult medical examination with abnormal findings: Secondary | ICD-10-CM

## 2021-08-07 ENCOUNTER — Ambulatory Visit: Payer: Self-pay

## 2021-09-14 ENCOUNTER — Other Ambulatory Visit: Payer: Self-pay | Admitting: Obstetrics

## 2021-09-14 DIAGNOSIS — Z1231 Encounter for screening mammogram for malignant neoplasm of breast: Secondary | ICD-10-CM

## 2021-09-17 ENCOUNTER — Ambulatory Visit
Admission: RE | Admit: 2021-09-17 | Discharge: 2021-09-17 | Disposition: A | Discharge status: Home / Self Care | Payer: BC Managed Care – PPO | Source: Ambulatory Visit | Attending: Obstetrics | Admitting: Obstetrics

## 2021-09-17 DIAGNOSIS — Z1231 Encounter for screening mammogram for malignant neoplasm of breast: Secondary | ICD-10-CM | POA: Diagnosis not present

## 2021-09-22 ENCOUNTER — Other Ambulatory Visit: Payer: Self-pay | Admitting: Family Medicine

## 2021-12-03 ENCOUNTER — Ambulatory Visit: Payer: BC Managed Care – PPO | Admitting: Family Medicine

## 2021-12-03 ENCOUNTER — Encounter: Payer: Self-pay | Admitting: Family Medicine

## 2021-12-03 VITALS — BP 100/72 | HR 76 | Temp 98.2°F | Ht 64.5 in | Wt 143.7 lb

## 2021-12-03 DIAGNOSIS — Z0000 Encounter for general adult medical examination without abnormal findings: Secondary | ICD-10-CM | POA: Diagnosis not present

## 2021-12-03 DIAGNOSIS — K219 Gastro-esophageal reflux disease without esophagitis: Secondary | ICD-10-CM

## 2021-12-03 DIAGNOSIS — E7800 Pure hypercholesterolemia, unspecified: Secondary | ICD-10-CM | POA: Diagnosis not present

## 2021-12-03 DIAGNOSIS — E559 Vitamin D deficiency, unspecified: Secondary | ICD-10-CM | POA: Diagnosis not present

## 2021-12-03 LAB — COMPREHENSIVE METABOLIC PANEL
ALT: 18 U/L (ref 0–35)
AST: 19 U/L (ref 0–37)
Albumin: 4.3 g/dL (ref 3.5–5.2)
Alkaline Phosphatase: 63 U/L (ref 39–117)
BUN: 17 mg/dL (ref 6–23)
CO2: 26 mEq/L (ref 19–32)
Calcium: 9.3 mg/dL (ref 8.4–10.5)
Chloride: 104 mEq/L (ref 96–112)
Creatinine, Ser: 0.99 mg/dL (ref 0.40–1.20)
GFR: 64.7 mL/min (ref >60.00)
Glucose, Bld: 94 mg/dL (ref 70–99)
Potassium: 4.2 mEq/L (ref 3.5–5.1)
Sodium: 137 mEq/L (ref 135–145)
Total Bilirubin: 0.6 mg/dL (ref 0.2–1.2)
Total Protein: 7.4 g/dL (ref 6.0–8.3)

## 2021-12-03 LAB — LIPID PANEL
Cholesterol: 156 mg/dL (ref 0–200)
HDL: 42 mg/dL (ref >39.00)
LDL Cholesterol: 84 mg/dL (ref 0–99)
NonHDL: 114.4
Total CHOL/HDL Ratio: 4
Triglycerides: 152 mg/dL — ABNORMAL HIGH (ref 0.0–149.0)
VLDL: 30.4 mg/dL (ref 0.0–40.0)

## 2021-12-03 LAB — VITAMIN D 25 HYDROXY (VIT D DEFICIENCY, FRACTURES): VITD: 25.81 ng/mL — ABNORMAL LOW (ref 30.00–100.00)

## 2021-12-03 MED ORDER — ATORVASTATIN CALCIUM 20 MG PO TABS: 20.0000 mg | ORAL_TABLET | Freq: Every day | ORAL | 1 refills | Status: DC

## 2021-12-03 NOTE — Assessment & Plan Note (Signed)
Normal physical exam findings today, we discuss anticipatory guidance listed above and handouts were given. We also discussed menopause and I gave guidance concerning this as well.

## 2021-12-03 NOTE — Assessment & Plan Note (Signed)
On prilosec 20 mg daily, stable sx on this dose, continue.

## 2021-12-03 NOTE — Progress Notes (Signed)
Established Patient Office Visit  Subjective   Patient ID: Robyn Medina, female    DOB: 1965/12/05  Age: 56 y.o. MRN: [id number]  Chief Complaint  Patient presents with   Establish Care   Medication Refill    Patient requests a refill on Atorvastatin '20mg'$     [email or web address]  Chief Complaint: Robyn Medina is a 56 y.o. female who presents today for her annual comprehensive physical exam.    Assessment/Plan: New/Acute Problems: None-- patient had questions about her oral contraceptives and when she would know if she has gone through menopause. We discussed that  OCP's can suppress the hypothalamic/pituitary axis and we cannot reliably use FSH to determine if she is in menopause or not. She does report she went through a period of time when she was having hot flashes and mood swings/ night sweats, but this has resolved. She reports she is still having regular periods every month. We discussed that we could try stopping the OCP's for 1 month and checking her [place] at the end of the month, she will think about this.  Chronic Problems Addressed Today: GERD-- sx well controlled on the prilosec 20 mg daily as needed  HLD-- pt on lipitor 20 mg daily, is compliant, denies any muscle cramps or fatigue. She is due for her annual labs today.  Vitamin D deficiency-- patient's numbers were low last year, is not currently on supplements for this, needs new level today.   Body mass index is 24.29 kg/m.     Preventative Healthcare: Handouts given and topics included daily recommended amount of exercise. We also discussed her immunizations and specifically getting the Shingrix vaccination series.  Patient Counseling(The following topics were reviewed and/or handout was given):   -Stressed the importance of regular exercise.   -Substance Abuse: Discussed cessation/primary prevention of tobacco, alcohol, or other drug use; driving or other dangerous activities under the influence;  availability of treatment for abuse.   -Injury prevention: Discussed safety belts, safety helmets, smoke detector, smoking near bedding or upholstery.   -Health maintenance and immunizations reviewed. Please refer to Health maintenance section.  Return to care in 1 year for next preventative visit.      Patient Active Problem List   Diagnosis Date Noted   Vitamin D deficiency 12/03/2021   Healthy adult on routine physical examination 12/03/2021   Herniation of right side of L4-L5 intervertebral disc 07/19/2016   Abnormal x-ray 07/12/2016   Acute bilateral low back pain with right-sided sciatica 07/12/2016   GERD (gastroesophageal reflux disease) 06/18/2015   Pure hypercholesterolemia 07/05/2013   Allergic rhinitis 05/18/2007   Social History   Tobacco Use   Smoking status: Never   Smokeless tobacco: Never  Vaping Use   Vaping Use: Never used  Substance Use Topics   Alcohol use: Yes    Alcohol/week: 1.0 standard drink of alcohol    Types: 1 Glasses of wine per week   Drug use: No      Review of Systems  Constitutional:  Negative for diaphoresis and malaise/fatigue.  HENT:  Negative for hearing loss.   Eyes:  Negative for blurred vision and double vision.  Respiratory:  Negative for shortness of breath.   Cardiovascular:  Negative for chest pain and leg swelling.  Gastrointestinal:  Negative for constipation, diarrhea, nausea and vomiting.  Genitourinary:  Negative for dysuria and urgency.  Musculoskeletal:  Negative for myalgias.  Neurological:  Negative for dizziness and headaches.  Psychiatric/Behavioral:  Negative for depression.   All other  systems reviewed and are negative.     Objective:     BP 100/72 (BP Location: Left Arm, Patient Position: Sitting, Cuff Size: Normal)   Pulse 76   Temp 98.2 F (36.8 C) (Oral)   Ht 5' 4.5" (1.638 m)   Wt 143 lb 11.2 oz (65.2 kg)   LMP 11/25/2021 (Exact Date)   SpO2 98%   BMI 24.29 kg/m  BP Readings from Last 3  Encounters:  12/03/21 100/72  11/21/20 113/80  09/12/20 100/78      Physical Exam Vitals reviewed.  Constitutional:      Appearance: Normal appearance. She is well-groomed and normal weight.  HENT:     Head: Normocephalic and atraumatic.     Right Ear: Tympanic membrane and ear canal normal.     Left Ear: Tympanic membrane and ear canal normal.     Mouth/Throat:     Mouth: Mucous membranes are moist.     Pharynx: Oropharynx is clear.  Eyes:     Extraocular Movements: Extraocular movements intact.     Conjunctiva/sclera: Conjunctivae normal.     Pupils: Pupils are equal, round, and reactive to light.  Cardiovascular:     Rate and Rhythm: Normal rate and regular rhythm.     Pulses: Normal pulses.     Heart sounds: S1 normal and S2 normal.  Pulmonary:     Effort: Pulmonary effort is normal.     Breath sounds: Normal breath sounds and air entry.  Abdominal:     General: Abdomen is flat. Bowel sounds are normal.     Palpations: Abdomen is soft.  Musculoskeletal:        General: Normal range of motion.     Cervical back: Normal range of motion and neck supple.     Right lower leg: No edema.     Left lower leg: No edema.  Skin:    General: Skin is warm and dry.  Neurological:     General: No focal deficit present.     Mental Status: She is alert and oriented to person, place, and time. Mental status is at baseline.     Gait: Gait is intact.  Psychiatric:        Mood and Affect: Mood and affect normal.        Speech: Speech normal.        Behavior: Behavior normal.        Judgment: Judgment normal.      No results found for any visits on 12/03/21.     The 10-year ASCVD risk score (Arnett DK, et al., 2019) is: 1.7%    Assessment & Plan:   Problem List Items Addressed This Visit       Digestive   GERD (gastroesophageal reflux disease) - Primary    On prilosec 20 mg daily, stable sx on this dose, continue.        Other   Pure hypercholesterolemia    She is  due for her annual labs today. She will continue lipitor 20 mg daily and we will be checking CMP today for hepatic function monitoring.      Relevant Medications   atorvastatin (LIPITOR) 20 MG tablet   Other Relevant Orders   CMP   Lipid Panel   Healthy adult on routine physical examination    Normal physical exam findings today, we discuss anticipatory guidance listed above and handouts were given. We also discussed menopause and I gave guidance concerning this as well.  Vitamin D deficiency (Chronic)    Had low vitamin D last year, has not been on supplements recently, will order a new level and suggest supplements if indicated. e      Relevant Orders   Vitamin D, 25-hydroxy    Return in about 1 year (around 12/04/2022) for preventative health visit.    Farrel Conners, MD

## 2021-12-03 NOTE — Assessment & Plan Note (Signed)
She is due for her annual labs today. She will continue lipitor 20 mg daily and we will be checking CMP today for hepatic function monitoring.

## 2021-12-03 NOTE — Assessment & Plan Note (Signed)
Had low vitamin D last year, has not been on supplements recently, will order a new level and suggest supplements if indicated. e

## 2021-12-21 ENCOUNTER — Other Ambulatory Visit: Payer: Self-pay | Admitting: *Deleted

## 2021-12-21 DIAGNOSIS — Z0001 Encounter for general adult medical examination with abnormal findings: Secondary | ICD-10-CM

## 2021-12-21 DIAGNOSIS — E7800 Pure hypercholesterolemia, unspecified: Secondary | ICD-10-CM

## 2021-12-21 MED ORDER — OMEPRAZOLE 20 MG PO CPDR: 20.0000 mg | DELAYED_RELEASE_CAPSULE | Freq: Every day | ORAL | 1 refills | Status: DC | PRN

## 2022-01-07 ENCOUNTER — Encounter: Payer: Self-pay | Admitting: Family Medicine

## 2022-01-17 ENCOUNTER — Other Ambulatory Visit: Payer: Self-pay

## 2022-01-17 ENCOUNTER — Ambulatory Visit
Admission: RE | Admit: 2022-01-17 | Discharge: 2022-01-17 | Disposition: A | Discharge status: Home / Self Care | Payer: BC Managed Care – PPO | Source: Ambulatory Visit | Attending: Physician Assistant | Admitting: Physician Assistant

## 2022-01-17 VITALS — BP 88/64 | HR 98 | Temp 98.9°F | Resp 18

## 2022-01-17 DIAGNOSIS — J069 Acute upper respiratory infection, unspecified: Secondary | ICD-10-CM

## 2022-01-17 DIAGNOSIS — Z1152 Encounter for screening for COVID-19: Secondary | ICD-10-CM

## 2022-01-17 LAB — RESP PANEL BY RT-PCR (FLU A&B, COVID) ARPGX2
Influenza A by PCR: NEGATIVE
Influenza B by PCR: NEGATIVE
SARS Coronavirus 2 by RT PCR: POSITIVE — AB

## 2022-01-17 NOTE — ED Triage Notes (Signed)
Complains of headache, sore throat, body aches, hot spells, cough.    Patient flew to [facility] and was stuffy nose the complete time.  Flew back Friday night, but Saturday morning, symptoms significantly worsened.    Patient has taken tylenol cold and flu, no relief

## 2022-01-17 NOTE — ED Provider Notes (Signed)
[facility]    CSN: [id number] Arrival date & time: 01/17/22  0851      History   Chief Complaint Chief Complaint  Patient presents with   Headache    Headache, sore throat coughing and whole body aches - Entered by patient    HPI Robyn Medina is a 56 y.o. female.   Patient here today for evaluation of headache, sore throat, body aches and cough that started yesterday morning.  She reports that she was having some congestion while she was in [place] last week however symptoms worsened when she flew back home.  She has tried taking Tylenol Cold and flu without significant relief.  She has had fever but has not been able to check her temperature.  She denies any vomiting or diarrhea.  The history is provided by the patient.  Headache Associated symptoms: congestion, cough, fever, myalgias and sore throat   Associated symptoms: no abdominal pain, no diarrhea, no ear pain, no nausea and no vomiting     Past Medical History:  Diagnosis Date   ALLERGIC RHINITIS 05/18/2007   Left arm pain    Near syncope    PARESTHESIA 03/03/2007   left arm numbness- resolved unclear cause   Sinusitis 09/24/2010    Patient Active Problem List   Diagnosis Date Noted   Vitamin D deficiency 12/03/2021   Healthy adult on routine physical examination 12/03/2021   Herniation of right side of L4-L5 intervertebral disc 07/19/2016   Abnormal x-ray 07/12/2016   Acute bilateral low back pain with right-sided sciatica 07/12/2016   GERD (gastroesophageal reflux disease) 06/18/2015   Pure hypercholesterolemia 07/05/2013   Allergic rhinitis 05/18/2007    Past Surgical History:  Procedure Laterality Date   none      OB History   No obstetric history on file.      Home Medications    Prior to Admission medications   Medication Sig Start Date End Date Taking? Authorizing Provider  atorvastatin (LIPITOR) 20 MG tablet Take 1 tablet (20 mg total) by mouth daily. 12/03/21   Farrel Conners, MD  Norgestimate-Ethinyl Estradiol Triphasic 0.18/0.215/0.25 MG-35 MCG tablet Take 1 tablet by mouth at bedtime. 06/24/12   [provider]  omeprazole (PRILOSEC) 20 MG capsule Take 1 capsule (20 mg total) by mouth daily as needed (heartburn). 12/21/21   Farrel Conners, MD    Family History Family History  Problem Relation Age of Onset   CAD Father 3       former smoker- stents   Heart disease Paternal Uncle        several   CAD Cousin        died 81 years old   Colon cancer Neg Hx    Colon polyps Neg Hx    Esophageal cancer Neg Hx    Rectal cancer Neg Hx    Stomach cancer Neg Hx    Breast cancer Neg Hx     Social History Social History   Tobacco Use   Smoking status: Never   Smokeless tobacco: Never  Vaping Use   Vaping Use: Never used  Substance Use Topics   Alcohol use: Yes    Alcohol/week: 1.0 standard drink of alcohol    Types: 1 Glasses of wine per week   Drug use: No     Allergies   Patient has no known allergies.   Review of Systems Review of Systems  Constitutional:  Positive for chills, diaphoresis and fever.  HENT:  Positive for congestion and sore throat. Negative for ear pain.   Eyes:  Negative for discharge and redness.  Respiratory:  Positive for cough. Negative for shortness of breath and wheezing.   Gastrointestinal:  Negative for abdominal pain, diarrhea, nausea and vomiting.  Musculoskeletal:  Positive for myalgias.  Neurological:  Positive for headaches.     Physical Exam Triage Vital Signs ED Triage Vitals  Enc Vitals Group     BP      Pulse      Resp      Temp      Temp src      SpO2      Weight      Height      Head Circumference      Peak Flow      Pain Score      Pain Loc      Pain Edu?      Excl. in [place]?    No data found.  Updated Vital Signs BP (!) 88/64 (BP Location: Right Arm) Comment (BP Location): small cuff  Pulse 98   Temp 98.9 F (37.2 C) (Oral)   Resp 18   LMP 12/20/2021   SpO2  97%      Physical Exam Vitals and nursing note reviewed.  Constitutional:      General: She is not in acute distress.    Appearance: Normal appearance.     Comments: Patient appears to not feel well  HENT:     Head: Normocephalic and atraumatic.     Nose: Congestion present.     Mouth/Throat:     Mouth: Mucous membranes are moist.     Pharynx: No oropharyngeal exudate or posterior oropharyngeal erythema.  Eyes:     Conjunctiva/sclera: Conjunctivae normal.  Cardiovascular:     Rate and Rhythm: Normal rate and regular rhythm.     Heart sounds: Normal heart sounds. No murmur heard. Pulmonary:     Effort: Pulmonary effort is normal. No respiratory distress.     Breath sounds: Normal breath sounds. No wheezing, rhonchi or rales.  Skin:    General: Skin is warm and dry.  Neurological:     Mental Status: She is alert.  Psychiatric:        Mood and Affect: Mood normal.        Thought Content: Thought content normal.      UC Treatments / Results  Labs (all labs ordered are listed, but only abnormal results are displayed) Labs Reviewed  RESP PANEL BY RT-PCR (FLU A&B, COVID) [id number]    EKG   Radiology No results found.  Procedures Procedures (including critical care time)  Medications Ordered in UC Medications - No data to display  Initial Impression / Assessment and Plan / UC Course  I have reviewed the triage vital signs and the nursing notes.  Pertinent labs & imaging results that were available during my care of the patient were reviewed by me and considered in my medical decision making (see chart for details).    Suspect viral etiology of symptoms.  Recommended symptomatic treatment and will screen for COVID and flu.  Will await results for further recommendation.  Final Clinical Impressions(s) / UC Diagnoses   Final diagnoses:  Encounter for screening for COVID-19  Acute upper respiratory infection   Discharge Instructions   None    ED Prescriptions    None    PDMP not reviewed this encounter.   Francene Finders, PA-C 01/17/22 1017

## 2022-01-18 ENCOUNTER — Encounter: Payer: Self-pay | Admitting: Family Medicine

## 2022-01-18 ENCOUNTER — Telehealth: Payer: Self-pay | Admitting: Nurse Practitioner

## 2022-01-18 ENCOUNTER — Telehealth: Payer: Self-pay

## 2022-01-18 ENCOUNTER — Telehealth: Payer: BC Managed Care – PPO | Admitting: Family Medicine

## 2022-01-18 MED ORDER — NIRMATRELVIR/RITONAVIR (PAXLOVID)TABLET: 3.0000 | ORAL_TABLET | Freq: Two times a day (BID) | ORAL | 0 refills | Status: AC

## 2022-01-18 NOTE — Telephone Encounter (Signed)
Sending in [place] for this patient; she is on day 2 of symptoms and tested positive 01/17/22.  Medical history hyperlipidemia, GERD, vitamin D deficiency.  Last GFR  >60 in August 2023.

## 2022-01-18 NOTE — Telephone Encounter (Signed)
I called the patient and scheduled a virtual visit with Dr Elease Hashimoto today at 2:45pm.

## 2022-01-18 NOTE — Telephone Encounter (Signed)
Paxlovid sent to [facility] in [place]. Pt was told to  stop her statin while on Paxlovid and also she should use back up method for preventing pregnancy (liek condoms) besides oral contraceptive because the antiviral makes the oral contraceptive less effective.  Pt agreed understanding.

## 2022-01-19 ENCOUNTER — Telehealth: Payer: Self-pay

## 2022-01-19 NOTE — Telephone Encounter (Signed)
Pt was told to stop the Paxlovid if she is not tolerating. Takes Tylenol ane keep hydration.

## 2022-01-19 NOTE — Progress Notes (Signed)
Pt was not seen..   visit was cancelled.   Eulas Post MD [facility]

## 2022-02-15 DIAGNOSIS — Z01419 Encounter for gynecological examination (general) (routine) without abnormal findings: Secondary | ICD-10-CM | POA: Diagnosis not present

## 2022-02-15 DIAGNOSIS — Z124 Encounter for screening for malignant neoplasm of cervix: Secondary | ICD-10-CM | POA: Diagnosis not present

## 2022-02-15 DIAGNOSIS — Z6825 Body mass index (BMI) 25.0-25.9, adult: Secondary | ICD-10-CM | POA: Diagnosis not present

## 2022-06-03 ENCOUNTER — Other Ambulatory Visit: Payer: Self-pay | Admitting: Family Medicine

## 2022-06-03 DIAGNOSIS — E7800 Pure hypercholesterolemia, unspecified: Secondary | ICD-10-CM

## 2022-06-16 ENCOUNTER — Other Ambulatory Visit: Payer: Self-pay | Admitting: Family Medicine

## 2022-06-16 DIAGNOSIS — E7800 Pure hypercholesterolemia, unspecified: Secondary | ICD-10-CM

## 2022-06-16 DIAGNOSIS — Z0001 Encounter for general adult medical examination with abnormal findings: Secondary | ICD-10-CM

## 2022-08-14 ENCOUNTER — Ambulatory Visit
Admission: EM | Admit: 2022-08-14 | Discharge: 2022-08-14 | Disposition: A | Discharge status: Home / Self Care | Payer: No Typology Code available for payment source | Attending: Family Medicine | Admitting: Family Medicine

## 2022-08-14 DIAGNOSIS — N3090 Cystitis, unspecified without hematuria: Secondary | ICD-10-CM | POA: Diagnosis present

## 2022-08-14 LAB — POCT URINALYSIS DIP (MANUAL ENTRY)
Bilirubin, UA: NEGATIVE
Glucose, UA: NEGATIVE mg/dL
Ketones, POC UA: NEGATIVE mg/dL
Nitrite, UA: NEGATIVE
Protein Ur, POC: NEGATIVE mg/dL
Spec Grav, UA: 1.015 (ref 1.010–1.025)
Urobilinogen, UA: 0.2 E.U./dL
pH, UA: 7.5 (ref 5.0–8.0)

## 2022-08-14 MED ORDER — CEPHALEXIN 500 MG PO CAPS: 500.0000 mg | ORAL_CAPSULE | Freq: Two times a day (BID) | ORAL | 0 refills | Status: DC

## 2022-08-14 NOTE — ED Triage Notes (Signed)
Pt reports frequent urination, low abdominal pain, and hurts to urinate  x 1 day.

## 2022-08-14 NOTE — ED Provider Notes (Signed)
[facility]    ASSESSMENT & PLAN:  1. Cystitis    Begin: Meds ordered this encounter  Medications   cephALEXin (KEFLEX) 500 MG capsule    Sig: Take 1 capsule (500 mg total) by mouth 2 (two) times daily.    Dispense:  10 capsule    Refill:  0   No signs of pyelonephritis. Urine culture sent. Will notify patient of any significant results. Will follow up with her PCP or here if not showing improvement over the next 48 hours, sooner if needed.  Outlined signs and symptoms indicating need for more acute intervention. Patient verbalized understanding. After Visit Summary given.  SUBJECTIVE:  Robyn Medina is a 57 y.o. female who complains of urinary frequency, urgency and dysuria for the past 1 day. Without associated flank pain, fever, chills, vaginal discharge or bleeding. Gross hematuria: not present. No specific aggravating or alleviating factors reported. No LE edema. Normal PO intake without n/v/d. Without specific abdominal pain. Ambulatory without difficulty. No tx PTA.  LMP: Patient's last menstrual period was 08/05/2022.  OBJECTIVE:  Vitals:   08/14/22 0817  BP: 106/76  Pulse: 81  Resp: 18  Temp: 98.1 F (36.7 C)  TempSrc: Oral  SpO2: 96%   General appearance: alert; no distress Lungs: unlabored respirations Extremities: no edema; symmetrical with no gross deformities Skin: warm and dry Neurologic: normal gait Psychological: alert and cooperative; normal mood and affect  Labs Reviewed  POCT URINALYSIS DIP (MANUAL ENTRY) - Abnormal; Notable for the following components:      Result Value   Blood, UA small (*)    Leukocytes, UA Small (1+) (*)    All other components within normal limits  URINE CULTURE    No Known Allergies  Past Medical History:  Diagnosis Date   ALLERGIC RHINITIS 05/18/2007   Left arm pain    Near syncope    PARESTHESIA 03/03/2007   left arm numbness- resolved unclear cause   Sinusitis 09/24/2010   Social History    Socioeconomic History   Marital status: Married    Spouse name: Not on file   Number of children: Not on file   Years of education: Not on file   Highest education level: Not on file  Occupational History   Not on file  Tobacco Use   Smoking status: Never   Smokeless tobacco: Never  Vaping Use   Vaping Use: Never used  Substance and Sexual Activity   Alcohol use: Yes    Alcohol/week: 1.0 standard drink of alcohol    Types: 1 Glasses of wine per week   Drug use: No   Sexual activity: Not on file  Other Topics Concern   Not on file  Social History Narrative   Married- Fayrene Fearing for 30 years in 2017. 2 children- [name] 79 and [name] 28.       Works at [employer]: walking, reading, eating   Social Determinants of Corporate investment banker Strain: Not on [employer] Insecurity: Not on file  Transportation Needs: Not on file  Physical Activity: Not on file  Stress: Not on file  Social Connections: Not on file  Intimate Partner Violence: Not on file   Family History  Problem Relation Age of Onset   CAD Father 77       former smoker- stents   Heart disease Paternal Uncle        several   CAD Cousin  died 38 years old   Colon cancer Neg Hx    Colon polyps Neg Hx    Esophageal cancer Neg Hx    Rectal cancer Neg Hx    Stomach cancer Neg Hx    Breast cancer Neg Hx         Mardella Layman, MD 08/14/22 [phone]

## 2022-08-14 NOTE — Discharge Instructions (Signed)
You have had labs (urine culture) sent today. We will call you with any significant abnormalities or if there is need to begin or change treatment or pursue further follow up.  You may also review your test results online through MyChart. If you do not have a MyChart account, instructions to sign up should be on your discharge paperwork.  

## 2022-08-16 LAB — URINE CULTURE: Culture: 20000 — AB

## 2022-10-01 ENCOUNTER — Encounter: Payer: Self-pay | Admitting: Family Medicine

## 2022-10-01 ENCOUNTER — Ambulatory Visit (INDEPENDENT_AMBULATORY_CARE_PROVIDER_SITE_OTHER): Payer: No Typology Code available for payment source | Admitting: Family Medicine

## 2022-10-01 VITALS — BP 100/60 | HR 80 | Temp 98.6°F | Ht 65.0 in | Wt 155.7 lb

## 2022-10-01 DIAGNOSIS — E7800 Pure hypercholesterolemia, unspecified: Secondary | ICD-10-CM

## 2022-10-01 DIAGNOSIS — E559 Vitamin D deficiency, unspecified: Secondary | ICD-10-CM | POA: Diagnosis not present

## 2022-10-01 DIAGNOSIS — R5383 Other fatigue: Secondary | ICD-10-CM

## 2022-10-01 DIAGNOSIS — Z0000 Encounter for general adult medical examination without abnormal findings: Secondary | ICD-10-CM | POA: Diagnosis not present

## 2022-10-01 LAB — LIPID PANEL
Cholesterol: 160 mg/dL (ref 0–200)
HDL: 45.8 mg/dL (ref >39.00)
LDL Cholesterol: 86 mg/dL (ref 0–99)
NonHDL: 113.72
Total CHOL/HDL Ratio: 3
Triglycerides: 141 mg/dL (ref 0.0–149.0)
VLDL: 28.2 mg/dL (ref 0.0–40.0)

## 2022-10-01 LAB — CBC
HCT: 42.2 % (ref 36.0–46.0)
Hemoglobin: 14 g/dL (ref 12.0–15.0)
MCHC: 33.3 g/dL (ref 30.0–36.0)
MCV: 91.8 fl (ref 78.0–100.0)
Platelets: 258 10*3/uL (ref 150.0–400.0)
RBC: 4.6 Mil/uL (ref 3.87–5.11)
RDW: 12.3 % (ref 11.5–15.5)
WBC: 7 10*3/uL (ref 4.0–10.5)

## 2022-10-01 LAB — COMPREHENSIVE METABOLIC PANEL
ALT: 20 U/L (ref 0–35)
AST: 18 U/L (ref 0–37)
Albumin: 4.2 g/dL (ref 3.5–5.2)
Alkaline Phosphatase: 54 U/L (ref 39–117)
BUN: 16 mg/dL (ref 6–23)
CO2: 25 mEq/L (ref 19–32)
Calcium: 9.1 mg/dL (ref 8.4–10.5)
Chloride: 105 mEq/L (ref 96–112)
Creatinine, Ser: 1.05 mg/dL (ref 0.40–1.20)
GFR: 59.94 mL/min — ABNORMAL LOW (ref >60.00)
Glucose, Bld: 92 mg/dL (ref 70–99)
Potassium: 4.6 mEq/L (ref 3.5–5.1)
Sodium: 138 mEq/L (ref 135–145)
Total Bilirubin: 0.5 mg/dL (ref 0.2–1.2)
Total Protein: 7.2 g/dL (ref 6.0–8.3)

## 2022-10-01 LAB — TSH: TSH: 1.85 u[IU]/mL (ref 0.35–5.50)

## 2022-10-01 LAB — VITAMIN D 25 HYDROXY (VIT D DEFICIENCY, FRACTURES): VITD: 53.25 ng/mL (ref 30.00–100.00)

## 2022-10-01 NOTE — Patient Instructions (Signed)

## 2022-10-01 NOTE — Progress Notes (Signed)
Complete physical exam  Patient: Robyn Medina   DOB: 1965-09-11   57 y.o. Female  MRN: [id number]  Subjective:    Chief Complaint  Patient presents with   Annual Exam    Robyn Medina is a 57 y.o. female who presents today for a complete physical exam. She reports consuming a general diet. The patient does not participate in regular exercise at present. She generally feels well. She reports sleeping well. She does not have additional problems to discuss today.   We discussed the CDC recommendations for exercise-- 30 minutes 5 days a week of moderate exercise.  Most recent fall risk assessment:    03/31/2017    2:24 PM  Fall Risk   Falls in the past year? No     Most recent depression screenings:    10/01/2022    8:04 AM 12/03/2021    7:58 AM  PHQ 2/9 Scores  PHQ - 2 Score 0 0  PHQ- 9 Score  3    Vision:Within last year and Dental: No current dental problems and Receives regular dental care  Patient Active Problem List   Diagnosis Date Noted   Vitamin D deficiency 12/03/2021   Healthy adult on routine physical examination 12/03/2021   Herniation of right side of L4-L5 intervertebral disc 07/19/2016   Abnormal x-ray 07/12/2016   Acute bilateral low back pain with right-sided sciatica 07/12/2016   GERD (gastroesophageal reflux disease) 06/18/2015   Pure hypercholesterolemia 07/05/2013   Allergic rhinitis 05/18/2007      Patient Care Team: Karie Georges, MD as PCP - General (Family Medicine) Marlow Baars, MD as Consulting Physician (Obstetrics)   Outpatient Medications Prior to Visit  Medication Sig   atorvastatin (LIPITOR) 20 MG tablet TAKE 1 TABLET BY MOUTH EVERY DAY   Norgestimate-Ethinyl Estradiol Triphasic 0.18/0.215/0.25 MG-35 MCG tablet Take 1 tablet by mouth at bedtime.   omeprazole (PRILOSEC) 20 MG capsule TAKE 1 CAPSULE BY MOUTH EVERY DAY AS NEEDED FOR HEARTBURN   [DISCONTINUED] cephALEXin (KEFLEX) 500 MG capsule Take 1 capsule (500 mg total) by mouth 2  (two) times daily.   No facility-administered medications prior to visit.    Review of Systems  Constitutional:  Positive for malaise/fatigue. Negative for weight loss.  HENT:  Negative for hearing loss.   Eyes:  Negative for blurred vision.  Respiratory:  Negative for shortness of breath.   Cardiovascular:  Negative for chest pain.  Gastrointestinal: Negative.  Negative for constipation, diarrhea and heartburn.  Genitourinary: Negative.   Musculoskeletal:  Negative for back pain.  Neurological:  Negative for headaches.  Psychiatric/Behavioral:  Negative for depression.        Objective:     BP 100/60 (BP Location: Left Arm, Patient Position: Sitting, Cuff Size: Normal)   Pulse 80   Temp 98.6 F (37 C) (Oral)   Ht 5\' 5"  (1.651 m)   Wt 155 lb 11.2 oz (70.6 kg)   LMP 10/01/2022 (Exact Date)   SpO2 98%   BMI 25.91 kg/m    Physical Exam Vitals reviewed.  Constitutional:      Appearance: Normal appearance. She is well-groomed and normal weight.  HENT:     Right Ear: Tympanic membrane normal.     Left Ear: Tympanic membrane normal.     Mouth/Throat:     Mouth: Mucous membranes are moist.     Pharynx: No posterior oropharyngeal erythema.  Eyes:     Conjunctiva/sclera: Conjunctivae normal.  Neck:     Thyroid: No  thyromegaly.  Cardiovascular:     Rate and Rhythm: Normal rate and regular rhythm.     Pulses: Normal pulses.     Heart sounds: S1 normal and S2 normal.  Pulmonary:     Effort: Pulmonary effort is normal.     Breath sounds: Normal breath sounds and air entry.  Abdominal:     General: Abdomen is flat. Bowel sounds are normal.     Palpations: Abdomen is soft.  Musculoskeletal:     Right lower leg: No edema.     Left lower leg: No edema.  Lymphadenopathy:     Cervical: No cervical adenopathy.  Neurological:     Mental Status: She is alert and oriented to person, place, and time. Mental status is at baseline.     Gait: Gait is intact.  Psychiatric:         Mood and Affect: Mood and affect normal.        Speech: Speech normal.        Behavior: Behavior normal.        Judgment: Judgment normal.      No results found for any visits on 10/01/22.     Assessment & Plan:    Routine Health Maintenance and Physical Exam  Immunization History  Administered Date(s) Administered   Influenza Split 05/25/2012   Influenza,inj,Quad PF,6+ Mos 01/09/2013, 02/06/2018, 04/18/2019   Influenza-Unspecified 02/18/2016, 01/25/2017, 02/08/2022   PFIZER(Purple Top)SARS-COV-2 Vaccination 11/15/2019, 12/06/2019   Tdap 06/18/2015   Zoster Recombinat (Shingrix) 02/08/2022    Health Maintenance  Topic Date Due   Zoster Vaccines- Shingrix (2 of 2) 04/05/2022   PAP SMEAR-Modifier  09/13/2022   COVID-19 Vaccine (3 - 2023-24 season) 10/17/2022 (Originally 12/25/2021)   Hepatitis C Screening  12/04/2022 (Originally 07/15/1983)   INFLUENZA VACCINE  11/25/2022   MAMMOGRAM  09/18/2023   DTaP/Tdap/Td (2 - Td or Tdap) 06/17/2025   Colonoscopy  06/21/2028   HIV Screening  Completed   HPV VACCINES  Aged Out    Discussed health benefits of physical activity, and encouraged her to engage in regular exercise appropriate for her age and condition.  Vitamin D deficiency -     VITAMIN D 25 Hydroxy (Vit-D Deficiency, Fractures)  Pure hypercholesterolemia -     Lipid panel  Healthy adult on routine physical examination -     Comprehensive metabolic panel  Routine general medical examination at a health care facility  Normal physical exam findings, we discussed increasing exercise to 30 minutes 5 days a week. Pt's goal will be to engage is a regular exercise regimen of her choosing. Will obtain annual labs for surveillance and monitoring. She is UTD on her HM, she [facility]. RTC 1 year for annual physical  Other fatigue -     TSH-- pt did complain of chronic fatigue on her ROS, always feeling tired, but did report  normal sleep patterns, will check TSH and CBC for work up.  -     CBC    Return in 1 year (on 10/01/2023).     Karie Georges, MD

## 2022-11-18 ENCOUNTER — Other Ambulatory Visit: Payer: Self-pay | Admitting: Obstetrics

## 2022-11-18 DIAGNOSIS — Z1231 Encounter for screening mammogram for malignant neoplasm of breast: Secondary | ICD-10-CM

## 2022-11-27 ENCOUNTER — Other Ambulatory Visit: Payer: Self-pay | Admitting: Family Medicine

## 2022-11-27 DIAGNOSIS — E7800 Pure hypercholesterolemia, unspecified: Secondary | ICD-10-CM

## 2022-12-07 ENCOUNTER — Ambulatory Visit
Admission: RE | Admit: 2022-12-07 | Discharge: 2022-12-07 | Disposition: A | Discharge status: Home / Self Care | Payer: No Typology Code available for payment source | Source: Ambulatory Visit | Attending: Obstetrics

## 2022-12-07 DIAGNOSIS — Z1231 Encounter for screening mammogram for malignant neoplasm of breast: Secondary | ICD-10-CM

## 2023-05-14 ENCOUNTER — Ambulatory Visit
Admission: RE | Admit: 2023-05-14 | Discharge: 2023-05-14 | Disposition: A | Discharge status: Home / Self Care | Payer: No Typology Code available for payment source | Source: Ambulatory Visit | Attending: Nurse Practitioner | Admitting: Nurse Practitioner

## 2023-05-14 VITALS — BP 118/79 | HR 84 | Temp 98.5°F | Resp 16

## 2023-05-14 DIAGNOSIS — B349 Viral infection, unspecified: Secondary | ICD-10-CM | POA: Insufficient documentation

## 2023-05-14 DIAGNOSIS — J029 Acute pharyngitis, unspecified: Secondary | ICD-10-CM | POA: Diagnosis present

## 2023-05-14 DIAGNOSIS — J069 Acute upper respiratory infection, unspecified: Secondary | ICD-10-CM | POA: Diagnosis present

## 2023-05-14 LAB — POC COVID19/FLU A&B COMBO
Covid Antigen, POC: NEGATIVE
Influenza A Antigen, POC: NEGATIVE
Influenza B Antigen, POC: NEGATIVE

## 2023-05-14 LAB — POCT RAPID STREP A (OFFICE): Rapid Strep A Screen: NEGATIVE

## 2023-05-14 MED ORDER — PROMETHAZINE-DM 6.25-15 MG/5ML PO SYRP: 5.0000 mL | ORAL_SOLUTION | Freq: Four times a day (QID) | ORAL | 0 refills | Status: AC | PRN

## 2023-05-14 MED ORDER — FLUTICASONE PROPIONATE 50 MCG/ACT NA SUSP: 2.0000 | Freq: Every day | NASAL | 0 refills | Status: AC

## 2023-05-14 NOTE — ED Provider Notes (Signed)
[facility]    CSN: [id number] Arrival date & time: 05/14/23  1243      History   Chief Complaint Chief Complaint  Patient presents with   Cough    Entered by patient    HPI Robyn Medina is a 58 y.o. female.   The history is provided by the patient.    Patient presents with a 2-day history of fatigue, body aches, headache, sore throat, cough, and diarrhea.  Patient states diarrhea has since improved.  Continues to experience the other symptoms.  Denies fever, ear pain, ear drainage, wheezing, difficulty breathing, chest pain, abdominal pain, nausea, or vomiting.  Patient reports she has been taking over-the-counter cough and cold medications for her symptoms with minimal relief.  Past Medical History:  Diagnosis Date   ALLERGIC RHINITIS 05/18/2007   Left arm pain    Near syncope    PARESTHESIA 03/03/2007   left arm numbness- resolved unclear cause   Sinusitis 09/24/2010    Patient Active Problem List   Diagnosis Date Noted   Vitamin D deficiency 12/03/2021   Healthy adult on routine physical examination 12/03/2021   Herniation of right side of L4-L5 intervertebral disc 07/19/2016   Abnormal x-ray 07/12/2016   Acute bilateral low back pain with right-sided sciatica 07/12/2016   GERD (gastroesophageal reflux disease) 06/18/2015   Pure hypercholesterolemia 07/05/2013   Allergic rhinitis 05/18/2007    Past Surgical History:  Procedure Laterality Date   none      OB History   No obstetric history on file.      Home Medications    Prior to Admission medications   Medication Sig Start Date End Date Taking? Authorizing Provider  fluticasone (FLONASE) 50 MCG/ACT nasal spray Place 2 sprays into both nostrils daily. 05/14/23  Yes Leath-Warren, Sadie Haber, NP  promethazine-dextromethorphan (PROMETHAZINE-DM) 6.25-15 MG/5ML syrup Take 5 mLs by mouth 4 (four) times daily as needed. 05/14/23  Yes Leath-Warren, Sadie Haber, NP  atorvastatin (LIPITOR) 20 MG tablet  TAKE 1 TABLET BY MOUTH EVERY DAY 11/29/22   Karie Georges, MD  Norgestimate-Ethinyl Estradiol Triphasic 0.18/0.215/0.25 MG-35 MCG tablet Take 1 tablet by mouth at bedtime. 06/24/12   [provider]  omeprazole (PRILOSEC) 20 MG capsule TAKE 1 CAPSULE BY MOUTH EVERY DAY AS NEEDED FOR HEARTBURN 06/17/22   Karie Georges, MD    Family History Family History  Problem Relation Age of Onset   CAD Father 27       former smoker- stents   Heart disease Paternal Uncle        several   CAD Cousin        died 44 years old   Colon cancer Neg Hx    Colon polyps Neg Hx    Esophageal cancer Neg Hx    Rectal cancer Neg Hx    Stomach cancer Neg Hx    Breast cancer Neg Hx     Social History Social History   Tobacco Use   Smoking status: Never   Smokeless tobacco: Never  Vaping Use   Vaping status: Never Used  Substance Use Topics   Alcohol use: Yes    Alcohol/week: 1.0 standard drink of alcohol    Types: 1 Glasses of wine per week   Drug use: No     Allergies   Patient has no known allergies.   Review of Systems Review of Systems Per HPI  Physical Exam Triage Vital Signs ED Triage Vitals [05/14/23 1251]  Encounter Vitals Group  BP 118/79     Systolic BP Percentile      Diastolic BP Percentile      Pulse Rate 84     Resp 16     Temp 98.5 F (36.9 C)     Temp Source Oral     SpO2 98 %     Weight      Height      Head Circumference      Peak Flow      Pain Score 0     Pain Loc      Pain Education      Exclude from Growth Chart    No data found.  Updated Vital Signs BP 118/79 (BP Location: Right Arm)   Pulse 84   Temp 98.5 F (36.9 C) (Oral)   Resp 16   LMP 10/01/2022 (Exact Date)   SpO2 98%   Visual Acuity Right Eye Distance:   Left Eye Distance:   Bilateral Distance:    Right Eye Near:   Left Eye Near:    Bilateral Near:     Physical Exam Vitals and nursing note reviewed.  Constitutional:      General: She is not in acute  distress.    Appearance: Normal appearance.  HENT:     Head: Normocephalic.     Right Ear: Tympanic membrane, ear canal and external ear normal.     Left Ear: Tympanic membrane, ear canal and external ear normal.     Nose: Congestion present.     Right Turbinates: Enlarged and swollen.     Left Turbinates: Enlarged and swollen.     Right Sinus: No maxillary sinus tenderness or frontal sinus tenderness.     Left Sinus: No maxillary sinus tenderness or frontal sinus tenderness.     Mouth/Throat:     Lips: Pink.     Mouth: Mucous membranes are moist.     Pharynx: Uvula midline. Posterior oropharyngeal erythema and postnasal drip present. No pharyngeal swelling, oropharyngeal exudate or uvula swelling.  Eyes:     Extraocular Movements: Extraocular movements intact.     Conjunctiva/sclera: Conjunctivae normal.     Pupils: Pupils are equal, round, and reactive to light.  Cardiovascular:     Rate and Rhythm: Normal rate and regular rhythm.     Pulses: Normal pulses.     Heart sounds: Normal heart sounds.  Pulmonary:     Effort: Pulmonary effort is normal.     Breath sounds: Normal breath sounds.  Abdominal:     General: Bowel sounds are normal.     Palpations: Abdomen is soft.     Tenderness: There is no abdominal tenderness.  Musculoskeletal:     Cervical back: Normal range of motion.  Lymphadenopathy:     Cervical: No cervical adenopathy.  Skin:    General: Skin is warm and dry.  Neurological:     General: No focal deficit present.     Mental Status: She is alert and oriented to person, place, and time.  Psychiatric:        Mood and Affect: Mood normal.        Behavior: Behavior normal.      UC Treatments / Results  Labs (all labs ordered are listed, but only abnormal results are displayed) Labs Reviewed  POC COVID19/FLU A&B COMBO - Normal  POCT RAPID STREP A (OFFICE) - Normal  CULTURE, GROUP A STREP [facility])    EKG   Radiology No results  found.  Procedures Procedures (including  critical care time)  Medications Ordered in UC Medications - No data to display  Initial Impression / Assessment and Plan / UC Course  I have reviewed the triage vital signs and the nursing notes.  Pertinent labs & imaging results that were available during my care of the patient were reviewed by me and considered in my medical decision making (see chart for details).  The rapid strep test and influenza/COVID test was negative.  Throat culture is pending.  Symptoms consistent with a viral URI with cough.  Will start patient on Promethazine DM for cough, and fluticasone 50 micro nasal spray for nasal congestion.  Supportive care recommendations were provided and discussed with the patient to include fluids, rest, warm salt water gargles, normal saline nasal spray, and use of a humidifier at nighttime during sleep.  Discussed indications with the patient regarding follow-up.  Patient was in agreement this plan of care and verbalized understanding.  All questions were answered.  Patient stable for discharge.  Final Clinical Impressions(s) / UC Diagnoses   Final diagnoses:  Viral URI with cough  Sore throat  Viral illness     Discharge Instructions      The rapid strep test and COVID/flu test were negative.  A throat culture is pending.  You will be contacted if the pending test result is abnormal. Take medication as prescribed. Increase fluids and allow for plenty of rest. May take over-the-counter Tylenol or ibuprofen as needed for pain, fever, or general discomfort. Warm salt water gargles 3-4 times daily as needed for throat pain or discomfort.  Also recommend a soft diet to include soup, broth, yogurt, pudding, Jell-O, or popsicles. Recommend using a humidifier in your bedroom at nighttime during sleep and sleeping elevated on pillows while cough symptoms persist. This most likely is a viral illness.  Symptoms can last up to 7 to 10 days.  If  symptoms suddenly worsen before that time, or extend beyond that time, you may follow-up in this clinic or with your primary care physician for further evaluation. Follow-up as needed.      ED Prescriptions     Medication Sig Dispense Auth. Provider   promethazine-dextromethorphan (PROMETHAZINE-DM) 6.25-15 MG/5ML syrup Take 5 mLs by mouth 4 (four) times daily as needed. 118 mL Leath-Warren, Sadie Haber, NP   fluticasone (FLONASE) 50 MCG/ACT nasal spray Place 2 sprays into both nostrils daily. 16 g Leath-Warren, Sadie Haber, NP      PDMP not reviewed this encounter.   Abran Cantor, NP 05/14/23 1345

## 2023-05-14 NOTE — Discharge Instructions (Addendum)
The rapid strep test and COVID/flu test were negative.  A throat culture is pending.  You will be contacted if the pending test result is abnormal. Take medication as prescribed. Increase fluids and allow for plenty of rest. May take over-the-counter Tylenol or ibuprofen as needed for pain, fever, or general discomfort. Warm salt water gargles 3-4 times daily as needed for throat pain or discomfort.  Also recommend a soft diet to include soup, broth, yogurt, pudding, Jell-O, or popsicles. Recommend using a humidifier in your bedroom at nighttime during sleep and sleeping elevated on pillows while cough symptoms persist. This most likely is a viral illness.  Symptoms can last up to 7 to 10 days.  If symptoms suddenly worsen before that time, or extend beyond that time, you may follow-up in this clinic or with your primary care physician for further evaluation. Follow-up as needed.

## 2023-05-14 NOTE — ED Triage Notes (Signed)
Pt states cough,diarrhea,headache, body aches for the past 2 days.

## 2023-05-17 LAB — CULTURE, GROUP A STREP (THRC)

## 2023-05-18 ENCOUNTER — Ambulatory Visit: Payer: No Typology Code available for payment source | Admitting: Adult Health

## 2023-05-18 VITALS — BP 110/80 | HR 94 | Temp 98.8°F | Ht 65.0 in | Wt 159.0 lb

## 2023-05-18 DIAGNOSIS — J069 Acute upper respiratory infection, unspecified: Secondary | ICD-10-CM | POA: Diagnosis not present

## 2023-05-18 MED ORDER — PREDNISONE 10 MG PO TABS: ORAL_TABLET | ORAL | 0 refills | Status: AC

## 2023-05-18 MED ORDER — BENZONATATE 200 MG PO CAPS: 200.0000 mg | ORAL_CAPSULE | Freq: Three times a day (TID) | ORAL | 0 refills | Status: AC | PRN

## 2023-05-18 NOTE — Progress Notes (Signed)
Subjective:    Patient ID: Robyn Medina, female    DOB: 11/16/1965, 58 y.o.   MRN: [id number]  HPI 58 year old female who  has a past medical history of ALLERGIC RHINITIS (05/18/2007), Left arm pain, Near syncope, PARESTHESIA (03/03/2007), and Sinusitis (09/24/2010).  She is a patient of Dr. Casimiro Needle who I am seeing today for follow up.   4 days ago she was seen at [facility] for a two day history of fatigue, body aches, headache, sore throat cough and diarrhea. At this time she reported the diarrhea has improved. She was taking OTC cough and cold medications with minimal symptoms   She had a rapid strep, influenza and Covid tests done which were negative. Her throat culture was negative.   She was diagnosed with viral URI and prescribed Promethazine DM for cough and fluticasone nasal spray.   Since being seen in at Urgent Care she feels as though her symptoms have become worse. She has not had a fever but will get " hot flashes". The cough is semi productive but mostly dry and is constant. Cough is keeping her up at night.   She does not currently have nasal congestion, sinus pressure or pain and her diarrhea has resolved.    Review of Systems See HPI   Past Medical History:  Diagnosis Date   ALLERGIC RHINITIS 05/18/2007   Left arm pain    Near syncope    PARESTHESIA 03/03/2007   left arm numbness- resolved unclear cause   Sinusitis 09/24/2010    Social History   Socioeconomic History   Marital status: Married    Spouse name: Not on file   Number of children: Not on file   Years of education: Not on file   Highest education level: Not on file  Occupational History   Not on file  Tobacco Use   Smoking status: Never   Smokeless tobacco: Never  Vaping Use   Vaping status: Never Used  Substance and Sexual Activity   Alcohol use: Yes    Alcohol/week: 1.0 standard drink of alcohol    Types: 1 Glasses of wine per week   Drug use: No   Sexual activity: Not on file   Other Topics Concern   Not on file  Social History Narrative   Married- Fayrene Fearing for 30 years in 2017. 2 children- [name] 68 and [name] 28.       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, clinical: walking, reading, eating   Social Drivers of Corporate investment banker Strain: Not on [employer] Insecurity: Not on file  Transportation Needs: Not on file  Physical Activity: Not on file  Stress: Not on file  Social Connections: Not on file  Intimate Partner Violence: Not on file    Past Surgical History:  Procedure Laterality Date   none      Family History  Problem Relation Age of Onset   CAD Father 48       former smoker- stents   Heart disease Paternal Uncle        several   CAD Cousin        died 72 years old   Colon cancer Neg Hx    Colon polyps Neg Hx    Esophageal cancer Neg Hx    Rectal cancer Neg Hx    Stomach cancer Neg Hx    Breast cancer Neg Hx     No Known Allergies  Current Outpatient  Medications on File Prior to Visit  Medication Sig Dispense Refill   atorvastatin (LIPITOR) 20 MG tablet TAKE 1 TABLET BY MOUTH EVERY DAY 90 tablet 1   fluticasone (FLONASE) 50 MCG/ACT nasal spray Place 2 sprays into both nostrils daily. 16 g 0   Norgestimate-Ethinyl Estradiol Triphasic 0.18/0.215/0.25 MG-35 MCG tablet Take 1 tablet by mouth at bedtime.     omeprazole (PRILOSEC) 20 MG capsule TAKE 1 CAPSULE BY MOUTH EVERY DAY AS NEEDED FOR HEARTBURN 90 capsule 1   promethazine-dextromethorphan (PROMETHAZINE-DM) 6.25-15 MG/5ML syrup Take 5 mLs by mouth 4 (four) times daily as needed. 118 mL 0   No current facility-administered medications on file prior to visit.    BP 110/80   Pulse 94   Temp 98.8 F (37.1 C) (Oral)   Ht 5\' 5"  (1.651 m)   Wt 159 lb (72.1 kg)   LMP 10/01/2022 (Exact Date)   SpO2 98%   BMI 26.46 kg/m       Objective:   Physical Exam Vitals and nursing note reviewed.  Constitutional:      Appearance: Normal appearance.  HENT:     Nose: No congestion or  rhinorrhea.     Right Turbinates: Not enlarged.     Left Turbinates: Not enlarged.     Right Sinus: No maxillary sinus tenderness or frontal sinus tenderness.     Left Sinus: No maxillary sinus tenderness or frontal sinus tenderness.     Mouth/Throat:     Mouth: Mucous membranes are moist.     Pharynx: Oropharynx is clear. No oropharyngeal exudate or posterior oropharyngeal erythema.     Tonsils: No tonsillar exudate.  Cardiovascular:     Rate and Rhythm: Regular rhythm.     Pulses: Normal pulses.     Heart sounds: Normal heart sounds.  Pulmonary:     Effort: Pulmonary effort is normal.     Breath sounds: Normal breath sounds.  Skin:    General: Skin is warm and dry.  Neurological:     General: No focal deficit present.     Mental Status: She is alert.  Psychiatric:        Mood and Affect: Mood normal.        Behavior: Behavior normal.        Thought Content: Thought content normal.        Judgment: Judgment normal.       Assessment & Plan:  1. Viral URI with cough (Primary) - No signs of bacterial infection. Possibly bronchitis. Will send in prednisone and tessalon pearls to help with symptoms. Follow up if not improving in the next 3-4 days or sooner if symptoms worsen  - Work note given  - predniSONE (DELTASONE) 10 MG tablet; 40 mg x 3 days, 20 mg x 3 days, 10 mg x 3 days  Dispense: 21 tablet; Refill: 0 - benzonatate (TESSALON) 200 MG capsule; Take 1 capsule (200 mg total) by mouth 3 (three) times daily as needed.  Dispense: 30 capsule; Refill: 0  Shirline Frees, NP

## 2023-06-14 ENCOUNTER — Other Ambulatory Visit: Payer: Self-pay | Admitting: Family Medicine

## 2023-06-14 DIAGNOSIS — E7800 Pure hypercholesterolemia, unspecified: Secondary | ICD-10-CM

## 2023-06-29 ENCOUNTER — Other Ambulatory Visit: Payer: Self-pay | Admitting: Family Medicine

## 2023-06-29 DIAGNOSIS — Z0001 Encounter for general adult medical examination with abnormal findings: Secondary | ICD-10-CM

## 2023-06-29 DIAGNOSIS — E7800 Pure hypercholesterolemia, unspecified: Secondary | ICD-10-CM

## 2023-09-19 ENCOUNTER — Other Ambulatory Visit: Payer: Self-pay | Admitting: Family Medicine

## 2023-09-19 DIAGNOSIS — E7800 Pure hypercholesterolemia, unspecified: Secondary | ICD-10-CM

## 2023-09-20 NOTE — Telephone Encounter (Signed)
 Pt needs to schedule her yearly follow up with me -- please have her schedule then ok to send back to me -- I will fill it until her appt

## 2023-11-09 ENCOUNTER — Other Ambulatory Visit: Payer: Self-pay | Admitting: Family Medicine

## 2023-11-09 DIAGNOSIS — Z1231 Encounter for screening mammogram for malignant neoplasm of breast: Secondary | ICD-10-CM

## 2023-11-27 ENCOUNTER — Other Ambulatory Visit: Payer: Self-pay | Admitting: Family Medicine

## 2023-11-27 DIAGNOSIS — E7800 Pure hypercholesterolemia, unspecified: Secondary | ICD-10-CM

## 2023-12-08 ENCOUNTER — Ambulatory Visit
Admission: RE | Admit: 2023-12-08 | Discharge: 2023-12-08 | Disposition: A | Discharge status: Home / Self Care | Source: Ambulatory Visit | Attending: Family Medicine | Admitting: Family Medicine

## 2023-12-08 DIAGNOSIS — Z1231 Encounter for screening mammogram for malignant neoplasm of breast: Secondary | ICD-10-CM

## 2023-12-09 ENCOUNTER — Ambulatory Visit: Payer: Self-pay | Admitting: Family Medicine

## 2024-05-01 ENCOUNTER — Ambulatory Visit: Admission: EM | Admit: 2024-05-01 | Discharge: 2024-05-01 | Disposition: A | Discharge status: Home / Self Care

## 2024-05-01 DIAGNOSIS — R110 Nausea: Secondary | ICD-10-CM

## 2024-05-01 DIAGNOSIS — R42 Dizziness and giddiness: Secondary | ICD-10-CM

## 2024-05-01 DIAGNOSIS — J069 Acute upper respiratory infection, unspecified: Secondary | ICD-10-CM | POA: Diagnosis not present

## 2024-05-01 DIAGNOSIS — R509 Fever, unspecified: Secondary | ICD-10-CM | POA: Diagnosis not present

## 2024-05-01 LAB — GLUCOSE, POCT (MANUAL RESULT ENTRY): POC Glucose: 106 mg/dL — AB (ref 70–99)

## 2024-05-01 LAB — POCT INFLUENZA A/B
Influenza A, POC: NEGATIVE
Influenza B, POC: NEGATIVE

## 2024-05-01 MED ORDER — ONDANSETRON 4 MG PO TBDP: 4.0000 mg | ORAL_TABLET | Freq: Three times a day (TID) | ORAL | 0 refills | Status: AC | PRN

## 2024-05-01 MED ORDER — OSELTAMIVIR PHOSPHATE 75 MG PO CAPS: 75.0000 mg | ORAL_CAPSULE | Freq: Two times a day (BID) | ORAL | 0 refills | Status: AC

## 2024-05-01 MED ORDER — ONDANSETRON 4 MG PO TBDP
4.0000 mg | ORAL_TABLET | Freq: Once | ORAL | Status: AC
  Administered 2024-05-01: 4 mg via ORAL

## 2024-05-01 NOTE — Discharge Instructions (Addendum)
 Make sure to stay well-hydrated, we have given you a dose of nausea medication while you were here today and I have prescribed more to the pharmacy as well as Tamiflu , the antiviral medication for the flu.  You may additionally take over-the-counter pain and fever reducers, cold and congestion medications as needed.  Follow-up for worsening or unresolving symptoms at any time.

## 2024-05-01 NOTE — ED Notes (Signed)
 Per provider, pt became hypotensive at start of provider exam. CMA in room to assist PA. CBG and repeat vitals obtained. Provider reported pt stable for discharge but would like pt to wait for ride. Pt verbalized understanding. Discharge instructions reviewed and pt currently in room awaiting transport home.

## 2024-05-01 NOTE — ED Provider Notes (Signed)
 " [facility]    CSN: [id number] Arrival date & time: 05/01/24  1629      History   Chief Complaint No chief complaint on file.   HPI Robyn Medina is a 59 y.o. female.   Patient presenting today with 2-day history of cough, fever, diarrhea, body aches, congestion, fatigue.  Denies chest pain, shortness of breath, vomiting, rashes.  So far tried cough medication over-the-counter with minimal relief.  No known pertinent chronic medical problems per patient.    Past Medical History:  Diagnosis Date   ALLERGIC RHINITIS 05/18/2007   Left arm pain    Near syncope    PARESTHESIA 03/03/2007   left arm numbness- resolved unclear cause   Sinusitis 09/24/2010    Patient Active Problem List   Diagnosis Date Noted   Vitamin D  deficiency 12/03/2021   Healthy adult on routine physical examination 12/03/2021   Herniation of right side of L4-L5 intervertebral disc 07/19/2016   Abnormal x-ray 07/12/2016   Acute bilateral low back pain with right-sided sciatica 07/12/2016   GERD (gastroesophageal reflux disease) 06/18/2015   Pure hypercholesterolemia 07/05/2013   Allergic rhinitis 05/18/2007    Past Surgical History:  Procedure Laterality Date   none      OB History   No obstetric history on file.      Home Medications    Prior to Admission medications  Medication Sig Start Date End Date Taking? Authorizing Provider  magnesium 30 MG tablet Take 30 mg by mouth 2 (two) times daily.   Yes [provider]  ondansetron  (ZOFRAN -ODT) 4 MG disintegrating tablet Take 1 tablet (4 mg total) by mouth every 8 (eight) hours as needed for nausea or vomiting. 05/01/24  Yes Stuart Vernell Norris, PA-C  oseltamivir  (TAMIFLU ) 75 MG capsule Take 1 capsule (75 mg total) by mouth every 12 (twelve) hours. 05/01/24  Yes Stuart Vernell Norris, PA-C  atorvastatin  (LIPITOR) 20 MG tablet TAKE 1 TABLET BY MOUTH EVERY DAY 11/28/23   Ozell Heron HERO, MD  benzonatate  (TESSALON ) 200 MG capsule  Take 1 capsule (200 mg total) by mouth 3 (three) times daily as needed. 05/18/23   Nafziger, Darleene, NP  fluticasone  (FLONASE ) 50 MCG/ACT nasal spray Place 2 sprays into both nostrils daily. 05/14/23   Leath-Warren, Etta PARAS, NP  Norgestimate-Ethinyl Estradiol Triphasic 0.18/0.215/0.25 MG-35 MCG tablet Take 1 tablet by mouth at bedtime. 06/24/12   [provider]  omeprazole  (PRILOSEC) 20 MG capsule TAKE 1 CAPSULE BY MOUTH EVERY DAY AS NEEDED FOR HEARTBURN 06/29/23   Ozell Heron HERO, MD  predniSONE  (DELTASONE ) 10 MG tablet 40 mg x 3 days, 20 mg x 3 days, 10 mg x 3 days 05/18/23   Nafziger, Cory, NP  promethazine -dextromethorphan (PROMETHAZINE -DM) 6.25-15 MG/5ML syrup Take 5 mLs by mouth 4 (four) times daily as needed. 05/14/23   Leath-Warren, Etta PARAS, NP    Family History Family History  Problem Relation Age of Onset   CAD Father 47       former smoker- stents   Heart disease Paternal Uncle        several   CAD Cousin        died 31 years old   Colon cancer Neg Hx    Colon polyps Neg Hx    Esophageal cancer Neg Hx    Rectal cancer Neg Hx    Stomach cancer Neg Hx    Breast cancer Neg Hx     Social History Social History[1]   Allergies  Patient has no known allergies.   Review of Systems Review of Systems Per HPI  Physical Exam Triage Vital Signs ED Triage Vitals  Encounter Vitals Group     BP 05/01/24 1802 131/83     Girls Systolic BP Percentile --      Girls Diastolic BP Percentile --      Boys Systolic BP Percentile --      Boys Diastolic BP Percentile --      Pulse Rate 05/01/24 1802 92     Resp 05/01/24 1802 18     Temp 05/01/24 1802 99.8 F (37.7 C)     Temp Source 05/01/24 1802 Oral     SpO2 05/01/24 1802 95 %     Weight --      Height --      Head Circumference --      Peak Flow --      Pain Score 05/01/24 1803 5     Pain Loc --      Pain Education --      Exclude from Growth Chart --    No data found.  Updated Vital Signs BP 106/72 (BP  Location: Right Arm) Comment: obtained by PA.  Pulse 92   Temp 99.8 F (37.7 C) (Oral)   Resp 18   LMP 10/01/2022   SpO2 95%   Visual Acuity Right Eye Distance:   Left Eye Distance:   Bilateral Distance:    Right Eye Near:   Left Eye Near:    Bilateral Near:     Physical Exam Vitals and nursing note reviewed.  Constitutional:      Appearance: Normal appearance.  HENT:     Head: Atraumatic.     Right Ear: Tympanic membrane and external ear normal.     Left Ear: Tympanic membrane and external ear normal.     Nose: Rhinorrhea present.     Mouth/Throat:     Mouth: Mucous membranes are moist.     Pharynx: Posterior oropharyngeal erythema present.  Eyes:     Extraocular Movements: Extraocular movements intact.     Conjunctiva/sclera: Conjunctivae normal.  Cardiovascular:     Rate and Rhythm: Normal rate and regular rhythm.     Heart sounds: Normal heart sounds.  Pulmonary:     Effort: Pulmonary effort is normal.     Breath sounds: Normal breath sounds. No wheezing or rales.  Musculoskeletal:        General: Normal range of motion.     Cervical back: Normal range of motion and neck supple.  Skin:    General: Skin is warm.  Neurological:     Mental Status: She is alert and oriented to person, place, and time.     Gait: Gait normal.  Psychiatric:        Mood and Affect: Mood normal.        Thought Content: Thought content normal.      UC Treatments / Results  Labs (all labs ordered are listed, but only abnormal results are displayed) Labs Reviewed  GLUCOSE, POCT (MANUAL RESULT ENTRY) - Abnormal; Notable for the following components:      Result Value   POC Glucose 106 (*)    All other components within normal limits  POCT INFLUENZA A/B    EKG   Radiology No results found.  Procedures Procedures (including critical care time)  Medications Ordered in UC Medications  ondansetron  (ZOFRAN -ODT) disintegrating tablet 4 mg (4 mg Oral Given 05/01/24 1828)  Initial Impression / Assessment and Plan / UC Course  I have reviewed the triage vital signs and the nursing notes.  Pertinent labs & imaging results that were available during my care of the patient were reviewed by me and considered in my medical decision making (see chart for details).     Upon time of examination, patient stated that she felt very lightheaded and like she was going to pass out.  While she never fully passed out, she became hypotensive to 60s over 40s, pale, diaphoretic.  Episode passed quickly, she was able to sip on Sprite with a cool rag on her head while she started improving and blood pressure normotensive upon discharge.  Suspect vasovagal episode.  Blood sugar 107 random during episode.  Do suspect flulike illness to be causing symptoms.  Treat with Tamiflu , Zofran , supportive over-the-counter medications and home care.  Patient was driven home by a family member after being advised not to drive after her episode.  ED for worsening symptoms at any time.  Final Clinical Impressions(s) / UC Diagnoses   Final diagnoses:  Lightheaded  Viral URI with cough  Fever, unspecified  Nausea     Discharge Instructions      Make sure to stay well-hydrated, we have given you a dose of nausea medication while you were here today and I have prescribed more to the pharmacy as well as Tamiflu , the antiviral medication for the flu.  You may additionally take over-the-counter pain and fever reducers, cold and congestion medications as needed.  Follow-up for worsening or unresolving symptoms at any time.    ED Prescriptions     Medication Sig Dispense Auth. Provider   oseltamivir  (TAMIFLU ) 75 MG capsule Take 1 capsule (75 mg total) by mouth every 12 (twelve) hours. 10 capsule Stuart Vernell Norris, PA-C   ondansetron  (ZOFRAN -ODT) 4 MG disintegrating tablet Take 1 tablet (4 mg total) by mouth every 8 (eight) hours as needed for nausea or vomiting. 20 tablet Stuart Vernell Norris, [place]      PDMP not reviewed this encounter.    [1]  Social History Tobacco Use   Smoking status: Never   Smokeless tobacco: Never  Vaping Use   Vaping status: Never Used  Substance Use Topics   Alcohol use: Yes    Alcohol/week: 1.0 standard drink of alcohol    Types: 1 Glasses of wine per week   Drug use: No     Stuart Vernell Norris, PA-C 05/01/24 1956  "

## 2024-05-01 NOTE — ED Triage Notes (Signed)
 Pt reports she has a cough, fever, diarrhea, body aches x 2 days  Took cough meds

## 2024-05-01 NOTE — ED Notes (Signed)
 Pt ride in room.

## 2024-05-23 ENCOUNTER — Other Ambulatory Visit: Payer: Self-pay | Admitting: Family Medicine

## 2024-05-23 DIAGNOSIS — E7800 Pure hypercholesterolemia, unspecified: Secondary | ICD-10-CM

## 2024-05-29 ENCOUNTER — Encounter: Admitting: Family Medicine

## 2024-06-01 ENCOUNTER — Encounter: Admitting: Family Medicine

## 2024-06-05 ENCOUNTER — Encounter: Admitting: Family Medicine
